# Patient Record
Sex: Female | Born: 1955 | Race: White | Hispanic: No | Marital: Married | Smoking: Never smoker
Health system: Southern US, Community
[De-identification: ages and names within clinical notes are randomized; demographics above are authoritative.]

## PROBLEM LIST (undated history)

## (undated) DIAGNOSIS — T8859XA Other complications of anesthesia, initial encounter: Secondary | ICD-10-CM

## (undated) DIAGNOSIS — R7303 Prediabetes: Secondary | ICD-10-CM

## (undated) DIAGNOSIS — I10 Essential (primary) hypertension: Secondary | ICD-10-CM

## (undated) DIAGNOSIS — E119 Type 2 diabetes mellitus without complications: Secondary | ICD-10-CM

## (undated) DIAGNOSIS — R609 Edema, unspecified: Secondary | ICD-10-CM

## (undated) DIAGNOSIS — T4145XA Adverse effect of unspecified anesthetic, initial encounter: Secondary | ICD-10-CM

## (undated) DIAGNOSIS — M1990 Unspecified osteoarthritis, unspecified site: Secondary | ICD-10-CM

## (undated) DIAGNOSIS — R600 Localized edema: Secondary | ICD-10-CM

## (undated) DIAGNOSIS — N6009 Solitary cyst of unspecified breast: Secondary | ICD-10-CM

## (undated) DIAGNOSIS — R112 Nausea with vomiting, unspecified: Secondary | ICD-10-CM

## (undated) DIAGNOSIS — Z9889 Other specified postprocedural states: Secondary | ICD-10-CM

## (undated) HISTORY — PX: TUBAL LIGATION: SHX77

## (undated) HISTORY — PX: APPENDECTOMY: SHX54

## (undated) HISTORY — PX: BREAST EXCISIONAL BIOPSY: SUR124

## (undated) HISTORY — DX: Prediabetes: R73.03

## (undated) HISTORY — DX: Solitary cyst of unspecified breast: N60.09

## (undated) HISTORY — PX: CHOLECYSTECTOMY: SHX55

---
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
## 1988-11-13 HISTORY — PX: CHOLECYSTECTOMY: SHX55

## 2001-05-14 ENCOUNTER — Other Ambulatory Visit: Admission: RE | Admit: 2001-05-14 | Discharge: 2001-05-14 | Payer: Self-pay | Admitting: *Deleted

## 2003-05-22 ENCOUNTER — Ambulatory Visit (HOSPITAL_COMMUNITY): Admission: RE | Admit: 2003-05-22 | Discharge: 2003-05-22 | Payer: Self-pay | Admitting: *Deleted

## 2003-05-22 ENCOUNTER — Encounter: Payer: Self-pay | Admitting: *Deleted

## 2003-12-28 ENCOUNTER — Inpatient Hospital Stay (HOSPITAL_COMMUNITY): Admission: AD | Admit: 2003-12-28 | Discharge: 2004-01-03 | Payer: Self-pay | Admitting: Family Medicine

## 2003-12-28 ENCOUNTER — Ambulatory Visit (HOSPITAL_COMMUNITY): Admission: RE | Admit: 2003-12-28 | Discharge: 2003-12-28 | Payer: Self-pay | Admitting: Family Medicine

## 2004-06-06 ENCOUNTER — Ambulatory Visit (HOSPITAL_COMMUNITY): Admission: RE | Admit: 2004-06-06 | Discharge: 2004-06-06 | Payer: Self-pay | Admitting: Family Medicine

## 2005-07-05 ENCOUNTER — Ambulatory Visit (HOSPITAL_COMMUNITY): Admission: RE | Admit: 2005-07-05 | Discharge: 2005-07-05 | Payer: Self-pay | Admitting: *Deleted

## 2006-06-27 ENCOUNTER — Ambulatory Visit (HOSPITAL_COMMUNITY): Admission: RE | Admit: 2006-06-27 | Discharge: 2006-06-27 | Payer: Self-pay | Admitting: Internal Medicine

## 2006-07-09 ENCOUNTER — Ambulatory Visit (HOSPITAL_COMMUNITY): Admission: RE | Admit: 2006-07-09 | Discharge: 2006-07-09 | Payer: Self-pay | Admitting: Family Medicine

## 2007-07-23 ENCOUNTER — Ambulatory Visit (HOSPITAL_COMMUNITY): Admission: RE | Admit: 2007-07-23 | Discharge: 2007-07-23 | Payer: Self-pay | Admitting: Family Medicine

## 2008-07-31 ENCOUNTER — Ambulatory Visit (HOSPITAL_COMMUNITY): Admission: RE | Admit: 2008-07-31 | Discharge: 2008-07-31 | Payer: Self-pay | Admitting: Family Medicine

## 2009-08-03 ENCOUNTER — Ambulatory Visit (HOSPITAL_COMMUNITY): Admission: RE | Admit: 2009-08-03 | Discharge: 2009-08-03 | Payer: Self-pay | Admitting: Family Medicine

## 2010-08-12 ENCOUNTER — Ambulatory Visit (HOSPITAL_COMMUNITY): Admission: RE | Admit: 2010-08-12 | Discharge: 2010-08-12 | Payer: Self-pay | Admitting: Family Medicine

## 2011-03-31 NOTE — Discharge Summary (Signed)
NAME:  Crystal Coleman, Crystal Coleman                         ACCOUNT NO.:  [id number]   MEDICAL RECORD NO.:  [id number]                   PATIENT TYPE:  INP   LOCATION:  A309                                 FACILITY:  [facility]   PHYSICIAN:  Angus G. Renard Matter, M.D.              DATE OF BIRTH:  1956-01-12   DATE OF ADMISSION:  12/28/2003  DATE OF DISCHARGE:  01/03/2004                                 DISCHARGE SUMMARY   DIAGNOSIS:  Deep vein thrombosis involving popliteal vein, superficial  femoral vein and greater saphenous vein.   CONDITION:  Stable and improved at the time of discharge.   HISTORY OF PRESENT ILLNESS:  A 55 year old white female entered the office  with complaint of pain in the left leg, three weeks duration, gradual onset.  The patient had no known injury.  She had pain behind the left knee  extending down the calf muscle, some swelling and cramps in her legs.  Patient examined and also noted to have a swollen left leg which was quite  tender.   ASSESSMENT:  Venous Doppler ultrasound was positive for DVT with thrombus  and popliteal vein, distal superficial femoral vein and greater saphenous  vein extended into the common femoral vein.  The patient was admitted to the  hospital for the purpose of anticoagulation and bed rest.   PHYSICAL EXAMINATION:  GENERAL APPEARANCE:  Uncomfortable white female with  blood pressure 130/80, pulse 60, respirations 18, temperature 98.  HEENT:  Eyes PERRLA.  TMs negative.  Oropharynx benign.  NECK:  Supple.  No JVD or thyroid abnormalities.  LUNGS:  Clear to P&A.  HEART:  Regular rate and rhythm.  No murmurs.  ABDOMEN:  No palpable organs or masses.  No hepatosplenomegaly.  PELVIC:  Not performed.  EXTREMITIES:  Tender, swollen left calf with some tenderness and swelling  over the left thigh.   LABORATORY DATA:  Admission CBC:  WBC 14,800, hemoglobin 13.9, hematocrit  40.3.  Chemistries on admission:  Sodium 139, potassium 3.8, chloride 103,  CO2 31, blood glucose 98, BUN 10, creatinine 0.8, calcium 8.7, total protein  7.1, albumin 3.3.  Subsequent chemistries on December 29, 2003:  Sodium 134,  potassium 3.8, chloride 102, CO2 28, glucose 95, BUN 9, creatinine 0.8,  calcium 8.4.  Liver enzymes on admission:  SGOT 19, SGPT 12, alkaline  phosphatase 87, total bilirubin 1.  Urinalysis 7-10 WBCs on voided specimen.  The patient's prothrombin time and INR range from normal to therapeutic by  hospital day #6 on January 03, 2004.  PT was 22.3 with INR of 2.6.   HOSPITAL COURSE:  The patient was placed at bedrest initially.  Vital signs  were monitored through q.i.d.  She was continued on Accupril 20 mg daily,  Loestrin 1/21 daily, Lovenox 1 mg/kilos subcutaneously q.12h., TED hose,  Ambien 10 mg q.h.s. p.r.n. sleep, Vicodin 5/500 p.r.n. pain.  The patient  was started on  subcutaneous Lovenox 1 mg/kilos q.12h. and Coumadin 5 mg  daily.  The patient showed progressive improvement.  Her Loestrin was  withheld.  She gradually became therapeutic with the Coumadin, although,  this was a small process.  She was dosed with Coumadin daily anywhere from  10-15 mg per day and finally INR did fall in therapeutic range at 2.6.  She  was discharged on hospital day #6.  TED hose were used in the hospital, and  it was recommended that she continue to use these as an outpatient.   DISCHARGE MEDICATIONS:  1. Vicodin 1 tablet q.4h. p.r.n. pain.  2. Accupril 20 mg daily.  3. Coumadin 7.5 mg daily.   DISCHARGE INSTRUCTIONS:  She was instructed to return to the physician's  office for follow up.     ___________________________________________                                         Ishmael Holter. Renard Matter, M.D.   AGM/MEDQ  D:  01/14/2004  T:  01/14/2004  Job:  [id number]

## 2011-03-31 NOTE — Group Therapy Note (Signed)
NAME:  REID, NAWROT                         ACCOUNT NO.:  [id number]   MEDICAL RECORD NO.:  [id number]                   PATIENT TYPE:  INP   LOCATION:  A309                                 FACILITY:  [facility]   PHYSICIAN:  Angus G. Renard Matter, M.D.              DATE OF BIRTH:  [birth year]   DATE OF PROCEDURE:  01/02/2004  DATE OF DISCHARGE:                                   PROGRESS NOTE   SUBJECTIVE:  This patient is being treated for DVT involving the left lower  leg and thigh.  She is being anticoagulated.  The pain and swelling is less  now in her thigh.  She is gradually becoming well anticoagulated.   OBJECTIVE:  Vital signs:  Blood pressure 140/69, respirations 18, pulse 72,  temperature 97.2.  Lungs:  Clear to P&A.  Heart:  Regular rhythm.  Abdomen:  No palpable organs or masses.  The patient does have some tenderness in the  left calf and left thigh.   ASSESSMENT:  The patient was admitted with deep vein thrombosis involving  the left leg.  Her condition remains stable.   PLAN:  Continue current regimen.  Will continue to monitor prothrombin time  and dose with Coumadin.  The patient should be able to be discharged  tomorrow.      ___________________________________________                                            Ishmael Holter Renard Matter, M.D.   AGM/MEDQ  D:  01/02/2004  T:  01/02/2004  Job:  [id number]

## 2011-03-31 NOTE — H&P (Signed)
NAME:  Crystal Coleman, Crystal Coleman                         ACCOUNT NO.:  [id number]   MEDICAL RECORD NO.:  [id number]                   PATIENT TYPE:  INP   LOCATION:  A309                                 FACILITY:  [facility]   PHYSICIAN:  Angus G. Renard Matter, M.D.              DATE OF BIRTH:  19-Jul-1956   DATE OF ADMISSION:  12/28/2003  DATE OF DISCHARGE:                                HISTORY & PHYSICAL   A 55 year old white female entered the office today with complaint of pain  in the left leg for three weeks duration of gradual onset. The patient has  had no known injury. She has had pain behind the knee and extending down in  the calf muscle, some swelling and cramps in the legs. The patient was  examined in the office and noted to have a swollen left leg which was quite  tender. Subsequent venous Doppler ultrasound was positive for deep venous  thrombosis with thrombus in  popliteal vein, distal superficial femoral  vein, and greater saphenous vein extending into the common femoral vein.  The patient was admitted to the hospital for the purpose of anticoagulation  and bedrest.   SOCIAL HISTORY:  The patient does not smoke or drink alcohol.   FAMILY HISTORY:  Noncontributory.   PAST MEDICAL/SURGICAL HISTORY:  The patient has a history of having had  tubal ligation in 1977. In 1990 she had cholecystectomy, appendectomy, and  cyst removed from ovary. She has had two pregnancies. No other medical  illnesses, except for hypertension and dyslipidemia.   MEDICATION LIST:  Accupril 20 mg daily.   ALLERGIES:  PENICILLIN.   REVIEW OF SYSTEMS:  HEENT: Negative. CARDIOPULMONARY: No cough, dyspnea, or  chest pain. GI:  No rebound, irregularity, or bleeding. GU: No dysuria or  hematuria.   PHYSICAL EXAMINATION:  GENERAL: Uncomfortable white female with blood  pressure 130/80, pulse 60, respirations 18, temperature 98. HEENT: PERRLA.  TMs negative. Oropharynx benign.  NECK: Supple with no JVD or thyroid  abnormalities.  LUNGS: Clear to auscultation and percussion.  HEART: Regular rhythm, no murmurs.  ABDOMEN: No palpable organs or masses. No hepatosplenomegaly.  PELVIC EXAM: Not performed.  EXTREMITIES: Tender, swollen left calf and also some tenderness and swelling  in the left thigh.   DIAGNOSIS:  Deep venous thrombosis involving the popliteal vein, superficial  femoral vein, and greater saphenous vein extending into the common femoral  vein.     ___________________________________________                                         Ishmael Holter. Renard Matter, M.D.   AGM/MEDQ  D:  12/28/2003  T:  12/28/2003  Job:  [id number]

## 2011-03-31 NOTE — Group Therapy Note (Signed)
NAME:  Crystal Coleman, Crystal Coleman                         ACCOUNT NO.:  [id number]   MEDICAL RECORD NO.:  [id number]                   PATIENT TYPE:  INP   LOCATION:  A309                                 FACILITY:  [facility]   PHYSICIAN:  Angus G. Renard Matter, M.D.              DATE OF BIRTH:  08-02-1956   DATE OF PROCEDURE:  DATE OF DISCHARGE:                                   PROGRESS NOTE   SUBJECTIVE:  This patient is being treated for deep vein thrombosis  involving the left-lower leg and thigh.  She is being anticoagulated.   OBJECTIVE:  Vital signs:  Blood pressure 138/93, respirations 20, pulse 93,  temperature 98.2.  Lungs clear to P&A.  Heart regular rhythm.  Abdomen, no  palpable organs or masses.  The patient does have continued tenderness in  the left calf and left thigh.   ASSESSMENT:  The patient was admitted with deep vein thrombosis involving  the left leg.  Her condition remains stable.   PLAN:  1. Continue current regimen.  2. We will give Coumadin 15 mg this a.m. and repeat prothrombin time this     p.m.      ___________________________________________                                            Ishmael Holter. Renard Matter, M.D.   AGM/MEDQ  D:  01/01/2004  T:  01/01/2004  Job:  [id number]

## 2011-03-31 NOTE — Group Therapy Note (Signed)
NAME:  Crystal Coleman, Crystal Coleman                         ACCOUNT NO.:  [id number]   MEDICAL RECORD NO.:  [id number]                   PATIENT TYPE:  INP   LOCATION:  A309                                 FACILITY:  [facility]   PHYSICIAN:  Angus G. Renard Matter, M.D.              DATE OF BIRTH:  13-Nov-1956   DATE OF PROCEDURE:  12/31/2003  DATE OF DISCHARGE:                                   PROGRESS NOTE   SUBJECTIVE:  This patient was admitted with DVT involving the left lower leg  and thigh.  She is being anticoagulated.   OBJECTIVE:  Vital signs:  Blood pressure 134/75, respirations 20, pulse 87,  temperature 98.2.  Lungs:  Clear to P&A.  Heart:  Regular rhythm.  Abdomen:  No palpable organs or masses.  The patient does have continued tenderness in  the left calf and left thigh.   ASSESSMENT:  The patient was admitted with deep vein thrombosis involving  the left leg.  Her condition remains stable.   PLAN:  Continue current regimen. Continue anticoagulation prior to  ambulation.      ___________________________________________                                            Ishmael Holter Renard Matter, M.D.   AGM/MEDQ  D:  12/31/2003  T:  12/31/2003  Job:  [id number]

## 2011-03-31 NOTE — Group Therapy Note (Signed)
NAME:  Crystal Coleman, Crystal Coleman                         ACCOUNT NO.:  [id number]   MEDICAL RECORD NO.:  [id number]                   PATIENT TYPE:  INP   LOCATION:  A309                                 FACILITY:  [facility]   PHYSICIAN:  Angus G. Renard Matter, M.D.              DATE OF BIRTH:  05-01-1956   DATE OF PROCEDURE:  DATE OF DISCHARGE:                                   PROGRESS NOTE   SUBJECTIVE:  This patient was admitted with DVT involving the left leg.  She  still has some pain in her leg and some swelling, but condition remains  stable.   OBJECTIVE:  VITAL SIGNS:  Blood pressure 129/70, respirations 20, pulse 102,  temperature 99.9.  LUNGS:  Lungs are clear to P&A.  HEART:  Regular rhythm.  ABDOMEN:  No palpable organs or masses.  EXTREMITIES:  The patient has some  tenderness in the calf of left leg and also in the thigh of left leg.   ASSESSMENT:  The patient was admitted with deep venous thrombosis involving  the left leg.   PLAN:  Plan to continue current efforts at anticoagulation.      ___________________________________________                                            Ishmael Holter Renard Matter, M.D.   AGM/MEDQ  D:  12/30/2003  T:  12/30/2003  Job:  [id number]

## 2011-03-31 NOTE — Group Therapy Note (Signed)
NAME:  Crystal Coleman, Crystal Coleman                         ACCOUNT NO.:  [id number]   MEDICAL RECORD NO.:  [id number]                   PATIENT TYPE:  INP   LOCATION:  A309                                 FACILITY:  [facility]   PHYSICIAN:  Angus G. Renard Matter, M.D.              DATE OF BIRTH:  1956/04/23   DATE OF PROCEDURE:  DATE OF DISCHARGE:                                   PROGRESS NOTE   This patient was admitted with DVT involving the left leg.  She has  continued to have some pain in her leg.   OBJECTIVE:  VITAL SIGNS: Blood pressure 139/86, respirations 20, pulse 92,  temperature 99.5.  LUNGS:  Clear to P&A.  HEART:  Regular rhythm.  ABDOMEN:  No palpable organs or masses.  EXTREMITIES:  Tenderness and swelling in the  calf of the left leg and also some tenderness in the left thigh.   ASSESSMENT:  The patient was admitted with DVT involving the left upper and  lower leg.   PLAN:  Plan to continue anticoagulation, daily prothrombin time.      ___________________________________________                                            Ishmael Holter Renard Matter, M.D.   AGM/MEDQ  D:  12/29/2003  T:  12/29/2003  Job:  [id number]

## 2011-08-15 ENCOUNTER — Other Ambulatory Visit (HOSPITAL_COMMUNITY): Payer: Self-pay | Admitting: Family Medicine

## 2011-08-15 DIAGNOSIS — Z139 Encounter for screening, unspecified: Secondary | ICD-10-CM

## 2011-09-05 ENCOUNTER — Ambulatory Visit (HOSPITAL_COMMUNITY)
Admission: RE | Admit: 2011-09-05 | Discharge: 2011-09-05 | Disposition: A | Discharge status: Home / Self Care | Payer: BC Managed Care – PPO | Source: Ambulatory Visit | Attending: Family Medicine | Admitting: Family Medicine

## 2011-09-05 DIAGNOSIS — Z1231 Encounter for screening mammogram for malignant neoplasm of breast: Secondary | ICD-10-CM | POA: Insufficient documentation

## 2011-09-05 DIAGNOSIS — Z139 Encounter for screening, unspecified: Secondary | ICD-10-CM

## 2012-08-26 ENCOUNTER — Other Ambulatory Visit (HOSPITAL_COMMUNITY): Payer: Self-pay | Admitting: Family Medicine

## 2012-08-26 DIAGNOSIS — IMO0001 Reserved for inherently not codable concepts without codable children: Secondary | ICD-10-CM

## 2012-09-10 ENCOUNTER — Ambulatory Visit (HOSPITAL_COMMUNITY)
Admission: RE | Admit: 2012-09-10 | Discharge: 2012-09-10 | Disposition: A | Discharge status: Home / Self Care | Payer: BC Managed Care – PPO | Source: Ambulatory Visit | Attending: Family Medicine | Admitting: Family Medicine

## 2012-09-10 DIAGNOSIS — IMO0001 Reserved for inherently not codable concepts without codable children: Secondary | ICD-10-CM

## 2012-09-10 DIAGNOSIS — Z1231 Encounter for screening mammogram for malignant neoplasm of breast: Secondary | ICD-10-CM | POA: Insufficient documentation

## 2013-09-16 ENCOUNTER — Other Ambulatory Visit (HOSPITAL_COMMUNITY): Payer: Self-pay | Admitting: Family Medicine

## 2013-09-16 DIAGNOSIS — Z139 Encounter for screening, unspecified: Secondary | ICD-10-CM

## 2013-09-29 ENCOUNTER — Ambulatory Visit (HOSPITAL_COMMUNITY)
Admission: RE | Admit: 2013-09-29 | Discharge: 2013-09-29 | Disposition: A | Discharge status: Home / Self Care | Payer: BC Managed Care – PPO | Source: Ambulatory Visit | Attending: Family Medicine | Admitting: Family Medicine

## 2013-09-29 DIAGNOSIS — Z1231 Encounter for screening mammogram for malignant neoplasm of breast: Secondary | ICD-10-CM | POA: Insufficient documentation

## 2013-09-29 DIAGNOSIS — Z139 Encounter for screening, unspecified: Secondary | ICD-10-CM

## 2014-09-08 ENCOUNTER — Other Ambulatory Visit (HOSPITAL_COMMUNITY): Payer: Self-pay | Admitting: Family Medicine

## 2014-09-08 DIAGNOSIS — Z1231 Encounter for screening mammogram for malignant neoplasm of breast: Secondary | ICD-10-CM

## 2014-10-01 ENCOUNTER — Ambulatory Visit (HOSPITAL_COMMUNITY)
Admission: RE | Admit: 2014-10-01 | Discharge: 2014-10-01 | Disposition: A | Discharge status: Home / Self Care | Payer: BC Managed Care – PPO | Source: Ambulatory Visit | Attending: Family Medicine | Admitting: Family Medicine

## 2014-10-01 DIAGNOSIS — Z1231 Encounter for screening mammogram for malignant neoplasm of breast: Secondary | ICD-10-CM | POA: Insufficient documentation

## 2015-06-11 ENCOUNTER — Ambulatory Visit (HOSPITAL_COMMUNITY): Payer: Self-pay

## 2015-06-11 ENCOUNTER — Ambulatory Visit (HOSPITAL_COMMUNITY)
Admission: RE | Admit: 2015-06-11 | Discharge: 2015-06-11 | Disposition: A | Discharge status: Home / Self Care | Payer: 59 | Source: Ambulatory Visit | Attending: Family Medicine | Admitting: Family Medicine

## 2015-06-11 ENCOUNTER — Other Ambulatory Visit (HOSPITAL_COMMUNITY): Payer: Self-pay | Admitting: Family Medicine

## 2015-06-11 DIAGNOSIS — Z8672 Personal history of thrombophlebitis: Secondary | ICD-10-CM

## 2015-06-11 DIAGNOSIS — M79662 Pain in left lower leg: Secondary | ICD-10-CM | POA: Insufficient documentation

## 2015-06-11 DIAGNOSIS — M25562 Pain in left knee: Secondary | ICD-10-CM | POA: Diagnosis not present

## 2015-06-11 DIAGNOSIS — M79605 Pain in left leg: Secondary | ICD-10-CM

## 2015-11-18 ENCOUNTER — Other Ambulatory Visit (HOSPITAL_COMMUNITY): Payer: Self-pay | Admitting: Family Medicine

## 2015-11-18 DIAGNOSIS — Z1231 Encounter for screening mammogram for malignant neoplasm of breast: Secondary | ICD-10-CM

## 2015-11-25 ENCOUNTER — Ambulatory Visit (HOSPITAL_COMMUNITY)
Admission: RE | Admit: 2015-11-25 | Discharge: 2015-11-25 | Disposition: A | Discharge status: Home / Self Care | Payer: 59 | Source: Ambulatory Visit | Attending: Family Medicine | Admitting: Family Medicine

## 2015-11-25 DIAGNOSIS — Z1231 Encounter for screening mammogram for malignant neoplasm of breast: Secondary | ICD-10-CM | POA: Diagnosis present

## 2016-07-16 ENCOUNTER — Other Ambulatory Visit: Payer: Self-pay | Admitting: Family Medicine

## 2016-12-06 ENCOUNTER — Observation Stay (HOSPITAL_COMMUNITY)
Admission: EM | Admit: 2016-12-06 | Discharge: 2016-12-08 | DRG: 603 | Disposition: A | Discharge status: Home / Self Care | Payer: 59 | Attending: Internal Medicine | Admitting: Internal Medicine

## 2016-12-06 ENCOUNTER — Encounter (HOSPITAL_COMMUNITY): Payer: Self-pay | Admitting: Emergency Medicine

## 2016-12-06 ENCOUNTER — Emergency Department (HOSPITAL_COMMUNITY): Payer: 59

## 2016-12-06 DIAGNOSIS — Z880 Allergy status to penicillin: Secondary | ICD-10-CM | POA: Diagnosis not present

## 2016-12-06 DIAGNOSIS — Z6841 Body Mass Index (BMI) 40.0 and over, adult: Secondary | ICD-10-CM | POA: Diagnosis not present

## 2016-12-06 DIAGNOSIS — L97919 Non-pressure chronic ulcer of unspecified part of right lower leg with unspecified severity: Principal | ICD-10-CM | POA: Insufficient documentation

## 2016-12-06 DIAGNOSIS — L03115 Cellulitis of right lower limb: Secondary | ICD-10-CM | POA: Diagnosis present

## 2016-12-06 DIAGNOSIS — E669 Obesity, unspecified: Secondary | ICD-10-CM | POA: Diagnosis not present

## 2016-12-06 DIAGNOSIS — R600 Localized edema: Secondary | ICD-10-CM | POA: Diagnosis present

## 2016-12-06 DIAGNOSIS — Z79899 Other long term (current) drug therapy: Secondary | ICD-10-CM | POA: Insufficient documentation

## 2016-12-06 DIAGNOSIS — I10 Essential (primary) hypertension: Secondary | ICD-10-CM | POA: Insufficient documentation

## 2016-12-06 HISTORY — DX: Unspecified osteoarthritis, unspecified site: M19.90

## 2016-12-06 HISTORY — DX: Essential (primary) hypertension: I10

## 2016-12-06 LAB — CBC WITH DIFFERENTIAL/PLATELET
BASOS ABS: 0 10*3/uL (ref 0.0–0.1)
BASOS PCT: 0 %
Eosinophils Absolute: 0.1 10*3/uL (ref 0.0–0.7)
Eosinophils Relative: 1 %
HEMATOCRIT: 42.5 % (ref 36.0–46.0)
HEMOGLOBIN: 14.6 g/dL (ref 12.0–15.0)
LYMPHS PCT: 22 %
Lymphs Abs: 2 10*3/uL (ref 0.7–4.0)
MCH: 31.9 pg (ref 26.0–34.0)
MCHC: 34.4 g/dL (ref 30.0–36.0)
MCV: 92.8 fL (ref 78.0–100.0)
MONO ABS: 1 10*3/uL (ref 0.1–1.0)
MONOS PCT: 11 %
NEUTROS ABS: 6.1 10*3/uL (ref 1.7–7.7)
NEUTROS PCT: 66 %
Platelets: 314 10*3/uL (ref 150–400)
RBC: 4.58 MIL/uL (ref 3.87–5.11)
RDW: 12 % (ref 11.5–15.5)
WBC: 9.3 10*3/uL (ref 4.0–10.5)

## 2016-12-06 LAB — BASIC METABOLIC PANEL
ANION GAP: 9 (ref 5–15)
BUN: 13 mg/dL (ref 6–20)
CHLORIDE: 101 mmol/L (ref 101–111)
CO2: 28 mmol/L (ref 22–32)
Calcium: 9.3 mg/dL (ref 8.9–10.3)
Creatinine, Ser: 0.71 mg/dL (ref 0.44–1.00)
GFR calc Af Amer: >60 mL/min (ref >60)
GFR calc non Af Amer: >60 mL/min (ref >60)
GLUCOSE: 101 mg/dL — AB (ref 65–99)
POTASSIUM: 3.4 mmol/L — AB (ref 3.5–5.1)
Sodium: 138 mmol/L (ref 135–145)

## 2016-12-06 LAB — LACTIC ACID, PLASMA: Lactic Acid, Venous: 1.9 mmol/L (ref 0.5–1.9)

## 2016-12-06 LAB — MAGNESIUM: Magnesium: 2 mg/dL (ref 1.7–2.4)

## 2016-12-06 LAB — SEDIMENTATION RATE: Sed Rate: 48 mm/hr — ABNORMAL HIGH (ref 0–22)

## 2016-12-06 MED ORDER — CLINDAMYCIN PHOSPHATE 600 MG/50ML IV SOLN
INTRAVENOUS | Status: AC
  Filled 2016-12-06: qty 50

## 2016-12-06 MED ORDER — QUINAPRIL HCL 10 MG PO TABS
20.0000 mg | ORAL_TABLET | Freq: Two times a day (BID) | ORAL | Status: DC
  Filled 2016-12-06 (×4): qty 2

## 2016-12-06 MED ORDER — ACETAMINOPHEN 325 MG PO TABS: 650.0000 mg | ORAL_TABLET | Freq: Four times a day (QID) | ORAL | Status: DC | PRN

## 2016-12-06 MED ORDER — POTASSIUM CHLORIDE CRYS ER 20 MEQ PO TBCR
40.0000 meq | EXTENDED_RELEASE_TABLET | Freq: Two times a day (BID) | ORAL | Status: DC
  Administered 2016-12-06 – 2016-12-08 (×4): 40 meq via ORAL
  Filled 2016-12-06 (×4): qty 2

## 2016-12-06 MED ORDER — SODIUM CHLORIDE 0.9 % IV SOLN
1.0000 g | Freq: Once | INTRAVENOUS | Status: AC
  Administered 2016-12-07: 1 g via INTRAVENOUS
  Filled 2016-12-06: qty 1

## 2016-12-06 MED ORDER — AMLODIPINE BESYLATE 5 MG PO TABS
5.0000 mg | ORAL_TABLET | Freq: Every day | ORAL | Status: DC
  Administered 2016-12-06 – 2016-12-08 (×3): 5 mg via ORAL
  Filled 2016-12-06 (×3): qty 1

## 2016-12-06 MED ORDER — ONDANSETRON HCL 4 MG/2ML IJ SOLN: 4.0000 mg | Freq: Four times a day (QID) | INTRAMUSCULAR | Status: DC | PRN

## 2016-12-06 MED ORDER — HYDROCODONE-ACETAMINOPHEN 5-325 MG PO TABS
1.0000 | ORAL_TABLET | ORAL | Status: DC | PRN
  Administered 2016-12-06 – 2016-12-08 (×4): 1 via ORAL
  Filled 2016-12-06 (×4): qty 1

## 2016-12-06 MED ORDER — ONDANSETRON HCL 4 MG PO TABS: 4.0000 mg | ORAL_TABLET | Freq: Four times a day (QID) | ORAL | Status: DC | PRN

## 2016-12-06 MED ORDER — VANCOMYCIN HCL 10 G IV SOLR
2000.0000 mg | Freq: Once | INTRAVENOUS | Status: AC
  Administered 2016-12-07: 2000 mg via INTRAVENOUS
  Filled 2016-12-06: qty 2000

## 2016-12-06 MED ORDER — HYDROCHLOROTHIAZIDE 12.5 MG PO CAPS
12.5000 mg | ORAL_CAPSULE | Freq: Every day | ORAL | Status: DC
  Administered 2016-12-08: 12.5 mg via ORAL
  Filled 2016-12-06: qty 1

## 2016-12-06 MED ORDER — CLINDAMYCIN PHOSPHATE 600 MG/50ML IV SOLN
600.0000 mg | Freq: Once | INTRAVENOUS | Status: AC
  Administered 2016-12-06: 600 mg via INTRAVENOUS
  Filled 2016-12-06: qty 50

## 2016-12-06 MED ORDER — ACETAMINOPHEN 650 MG RE SUPP: 650.0000 mg | Freq: Four times a day (QID) | RECTAL | Status: DC | PRN

## 2016-12-06 MED ORDER — ENOXAPARIN SODIUM 60 MG/0.6ML ~~LOC~~ SOLN
50.0000 mg | SUBCUTANEOUS | Status: DC
  Administered 2016-12-06 – 2016-12-07 (×2): 50 mg via SUBCUTANEOUS
  Filled 2016-12-06 (×2): qty 0.6

## 2016-12-06 MED ORDER — SENNOSIDES-DOCUSATE SODIUM 8.6-50 MG PO TABS: 1.0000 | ORAL_TABLET | Freq: Every evening | ORAL | Status: DC | PRN

## 2016-12-06 NOTE — ED Triage Notes (Signed)
Sent here by [facility] for right leg wound.  Denies any pain in right leg.  Dressing intact but redness noted outside to 4x4 dressing.  Area is hot to touch.  No history of DM.  Started out a small area to anterior lower leg.  Was treated with compression socks and area got worse.  Then pt treated with OTC wound gel and then got redness, swelling and heat.  All this occurred in one week period.

## 2016-12-06 NOTE — ED Provider Notes (Signed)
[place] [facility] DEPT Provider Note   CSN: [id number] Arrival date & time: 12/06/16  1416     History   Chief Complaint Chief Complaint  Patient presents with  . Extremity Weakness    right    HPI Crystal Coleman is a 61 y.o. female.  HPI  Pt was seen at 1450. Per pt, c/o gradual onset and worsening of persistent RLE "redness" and "wound" over the past 1 week. Pt states she started with a small area of "blister" on her right lower anterior mid-tibial area. States she was wearing compression socks and "it got bigger." The area has now ulcerated, draining purulent fluid, with surrounding redness and warmth. Denies pain in leg or calf. Denies focal motor weakness, no tingling/numbness in extremities, no fevers.   Past Medical History:  Diagnosis Date  . Arthritis   . Hypertension     There are no active problems to display for this patient.   Past Surgical History:  Procedure Laterality Date  . CHOLECYSTECTOMY      OB History    Gravida Para Term Preterm AB Living   2         3   SAB TAB Ectopic Multiple Live Births                   Home Medications    Prior to Admission medications   Medication Sig Start Date End Date Taking? Authorizing Provider  amLODipine (NORVASC) 5 MG tablet Take 5 mg by mouth daily. 11/08/16  Yes Historical Provider, MD  furosemide (LASIX) 20 MG tablet Take 20 mg by mouth as needed for fluid.  11/19/16  Yes Historical Provider, MD  ibuprofen (ADVIL,MOTRIN) 200 MG tablet Take 200 mg by mouth as needed for moderate pain.   Yes Historical Provider, MD  oxymetazoline (AFRIN) 0.05 % nasal spray Place 2 sprays into both nostrils as needed for congestion.   Yes Historical Provider, MD  quinapril (ACCUPRIL) 20 MG tablet Take 20 mg by mouth 2 (two) times daily. 11/19/16  Yes Historical Provider, MD  triamcinolone cream (KENALOG) 0.1 % Apply 1 application topically as needed (itch).  11/08/16  Yes Historical Provider, MD  hydrochlorothiazide (MICROZIDE)  12.5 MG capsule Take 12.5 mg by mouth daily. 09/10/16   Historical Provider, MD    Family History History reviewed. No pertinent family history.  Social History Social History  Substance Use Topics  . Smoking status: Never Smoker  . Smokeless tobacco: Never Used  . Alcohol use No     Allergies   Penicillins   Review of Systems Review of Systems ROS: Statement: All systems negative except as marked or noted in the HPI; Constitutional: Negative for fever and chills. ; ; Eyes: Negative for eye pain, redness and discharge. ; ; ENMT: Negative for ear pain, hoarseness, nasal congestion, sinus pressure and sore throat. ; ; Cardiovascular: Negative for chest pain, palpitations, diaphoresis, dyspnea and peripheral edema. ; ; Respiratory: Negative for cough, wheezing and stridor. ; ; Gastrointestinal: Negative for nausea, vomiting, diarrhea, abdominal pain, blood in stool, hematemesis, jaundice and rectal bleeding. . ; ; Genitourinary: Negative for dysuria, flank pain and hematuria. ; ; Musculoskeletal: Negative for back pain and neck pain. Negative for swelling and trauma.; ; Skin: Negative for pruritus, abrasions, blisters, bruising and +rash, skin ulceration..; ; Neuro: Negative for headache, lightheadedness and neck stiffness. Negative for weakness, altered level of consciousness, altered mental status, extremity weakness, paresthesias, involuntary movement, seizure and syncope.  Physical Exam Updated Vital Signs BP 194/93   Pulse 76   Temp 97.5 F (36.4 C) (Rectal)   Resp 16   Ht 5\' 3"  (1.6 m)   Wt 237 lb (107.5 kg)   SpO2 98%   BMI 41.98 kg/m   Physical Exam 1455: Physical examination:  Nursing notes reviewed; Vital signs and O2 SAT reviewed;  Constitutional: Well developed, Well nourished, Well hydrated, In no acute distress; Head:  Normocephalic, atraumatic; Eyes: EOMI, PERRL, No scleral icterus; ENMT: Mouth and pharynx normal, Mucous membranes moist; Neck: Supple, Full  range of motion, No lymphadenopathy; Cardiovascular: Regular rate and rhythm, No gallop; Respiratory: Breath sounds clear & equal bilaterally, No wheezes.  Speaking full sentences with ease, Normal respiratory effort/excursion; Chest: Nontender, Movement normal; Abdomen: Soft, Nontender, Nondistended, Normal bowel sounds; Genitourinary: No CVA tenderness; Extremities: Pulses normal, +right lower anterior mid-tibal area with 5cm diameter ulcerated wound, approximately 1.5cm deep, draining purulent fluid. +near circumferential surrounding erythema and warmth. No streaking up leg.  Strong pedal pulses. No tenderness, No edema, No calf asymmetry.; Neuro: AA&Ox3, Major CN grossly intact.  Speech clear. No gross focal motor or sensory deficits in extremities.; Skin: Color normal, Warm, Dry.   ED Treatments / Results  Labs (all labs ordered are listed, but only abnormal results are displayed)   EKG  EKG Interpretation None       Radiology   Procedures Procedures (including critical care time)  Medications Ordered in ED Medications  clindamycin (CLEOCIN) IVPB 600 mg (600 mg Intravenous New Bag/Given 12/06/16 1642)     Initial Impression / Assessment and Plan / ED Course  I have reviewed the triage vital signs and the nursing notes.  Pertinent labs & imaging results that were available during my care of the patient were reviewed by me and considered in my medical decision making (see chart for details).  MDM Reviewed: previous chart, nursing note and vitals Reviewed previous: labs Interpretation: labs and x-ray   Results for orders placed or performed during the hospital encounter of [id number]  Basic metabolic panel  Result Value Ref Range   Sodium 138 135 - 145 mmol/L   Potassium 3.4 (L) 3.5 - 5.1 mmol/L   Chloride 101 101 - 111 mmol/L   CO2 28 22 - 32 mmol/L   Glucose, Bld 101 (H) 65 - 99 mg/dL   BUN 13 6 - 20 mg/dL   Creatinine, Ser 0.71 0.44 - 1.00 mg/dL   Calcium 9.3 8.9 -  10.3 mg/dL   GFR calc non Af Amer >60 >60 mL/min   GFR calc Af Amer >60 >60 mL/min   Anion gap 9 5 - 15  Lactic acid, plasma  Result Value Ref Range   Lactic Acid, Venous 1.9 0.5 - 1.9 mmol/L  CBC with Differential  Result Value Ref Range   WBC 9.3 4.0 - 10.5 K/uL   RBC 4.58 3.87 - 5.11 MIL/uL   Hemoglobin 14.6 12.0 - 15.0 g/dL   HCT 42.5 36.0 - 46.0 %   MCV 92.8 78.0 - 100.0 fL   MCH 31.9 26.0 - 34.0 pg   MCHC 34.4 30.0 - 36.0 g/dL   RDW 12.0 11.5 - 15.5 %   Platelets 314 150 - 400 K/uL   Neutrophils Relative % 66 %   Neutro Abs 6.1 1.7 - 7.7 K/uL   Lymphocytes Relative 22 %   Lymphs Abs 2.0 0.7 - 4.0 K/uL   Monocytes Relative 11 %   Monocytes Absolute 1.0 0.1 - 1.0  K/uL   Eosinophils Relative 1 %   Eosinophils Absolute 0.1 0.0 - 0.7 K/uL   Basophils Relative 0 %   Basophils Absolute 0.0 0.0 - 0.1 K/uL   Dg Tibia/fibula Right Result Date: 12/06/2016 CLINICAL DATA:  Right lower leg wound with redness and swelling. EXAM: RIGHT TIBIA AND FIBULA - 2 VIEW COMPARISON:  None. FINDINGS: There is soft tissue irregularity and a rounded lucency in the anterior lower leg at the level of the mid tibia presumably corresponding to the known it wound. Subcutaneous fat reticulation is present throughout the lower leg. Swelling extends to the ankle. The tibia and fibula appear intact without evidence of fracture or erosion. Moderate marginal osteophytosis is present at the knee involving the medial and lateral compartments. Plantar and posterior calcaneal enthesophytes are present. A small well corticated ossicle projects adjacent to the medial malleolus. No dissecting soft tissue emphysema is identified. IMPRESSION: Cellulitic changes throughout the lower leg. No evidence of acute osseous abnormality. Electronically Signed   By: Logan Bores M.D.   On: 12/06/2016 15:46    1725:  IV clindamycin given after wound culture obtained. T/C to [facility]. Denton Brick, case discussed, including:  HPI, pertinent  PM/SHx, VS/PE, dx testing, ED course and treatment:  Agreeable to come to ED for evaluation.   Final Clinical Impressions(s) / ED Diagnoses   Final diagnoses:  None    New Prescriptions New Prescriptions   No medications on file     Francine Graven, DO 12/08/16 1612

## 2016-12-06 NOTE — H&P (Addendum)
Patient Demographics:    Crystal Coleman, is a 61 y.o. female  MRN: [id number]   DOB - 1956/10/12  Admit Date - 12/06/2016  Outpatient Primary MD for the patient is Jani Gravel, MD   Assessment & Plan:    Principal Problem:   Cellulitis of leg, right Active Problems:   Ulcer of right leg (HCC)   HTN (hypertension)   Lower extremity edema  Right lower extremity Cellulitis- Likely from purulent ulcer, with surrounding warmth and redness. No fevers or WBC, vitals stable, hypertensive, no lactic acidosis. Xray leg- neg for acute osseus abnormality, no gas. Ulcer location and appearance not indicative of arterial insufficiency-good pulses, or venous insufficiency. - Admit to inpt - MRI tibia/fib WO contrast - Vanc and meropenem for empiric coverage (pt has penicillin allergy) - ESR,CRP - Hgba1c - Wound care consult - f/u Blood culture results - F/u wound culture results in ED, but consider high likelihood of contaminating skin flora. - Please consider ordering ABI/and or arterial dopplers in am  HTN- bp- Home meds- HCTZ-12.5, norvasc 5, quinapril 20 BID - Low K, 3.4, replete 52mq Kdur -Bmet am - MAg level  With History of - Reviewed by me  Past Medical History:  Diagnosis Date  . Arthritis   . Hypertension       Past Surgical History:  Procedure Laterality Date  . CHOLECYSTECTOMY      Chief Complaint  Patient presents with  . Extremity Weakness    right      HPI:    Crystal Coleman is a 61y.o. female, with PMH- HTN, chronic lower extremity edema. Presented with Complaints of right lower extremity redness,warmth and pain that started 1 week ago.   Prior to that she had an ulcer mid leg, started about 1-2 months ago. She is not sure about how ulcer started, but she denies trauma or injury. She  says the compression stockings she wore and over-the-counter wound gel (from [facility]) she was applying has gradually made it worse.  She denies fever, malaise, and vomiting. No prior similar ulcers, not diagnosed with DM.   In the ED- vitals were stable, temp- 98.5 F, Xray leg- No acute osseous abnormality, WBC- 9.3, Lactic acid- 1.9. Pt was given  Clindamycin in the ED, hospitalist called for further evaluation.   Review of systems:    In addition to the HPI above,   A full 12 point Review of 10 Systems was done, except as stated above, all other Review of 10 Systems were negative.    Social History:  Reviewed by me    Social History  Substance Use Topics  . Smoking status: Never Smoker  . Smokeless tobacco: Never Used  . Alcohol use No       Family History :  Reviewed by me   History reviewed. No pertinent family history.   Home Medications:   Prior to Admission medications   Medication Sig Start Date  End Date Taking? Authorizing Provider  amLODipine (NORVASC) 5 MG tablet Take 5 mg by mouth daily. 11/08/16  Yes Historical Provider, MD  furosemide (LASIX) 20 MG tablet Take 20 mg by mouth as needed for fluid.  11/19/16  Yes Historical Provider, MD  ibuprofen (ADVIL,MOTRIN) 200 MG tablet Take 200 mg by mouth as needed for moderate pain.   Yes Historical Provider, MD  oxymetazoline (AFRIN) 0.05 % nasal spray Place 2 sprays into both nostrils as needed for congestion.   Yes Historical Provider, MD  quinapril (ACCUPRIL) 20 MG tablet Take 20 mg by mouth 2 (two) times daily. 11/19/16  Yes Historical Provider, MD  triamcinolone cream (KENALOG) 0.1 % Apply 1 application topically as needed (itch).  11/08/16  Yes Historical Provider, MD  hydrochlorothiazide (MICROZIDE) 12.5 MG capsule Take 12.5 mg by mouth daily. 09/10/16   Historical Provider, MD     Allergies:     Allergies  Allergen Reactions  . Penicillins     Has patient had a PCN reaction causing immediate rash,  facial/tongue/throat swelling, SOB or lightheadedness with hypotension: Yes Has patient had a PCN reaction causing severe rash involving mucus membranes or skin necrosis: No Has patient had a PCN reaction that required hospitalization No Has patient had a PCN reaction occurring within the last 10 years: No If all of the above answers are "NO", then may proceed with Cephalosporin use.      Physical Exam:   Vitals  Blood pressure 194/93, pulse 76, temperature 97.5 F (36.4 C), temperature source Rectal, resp. rate 16, height '5\' 3"'  (1.6 m), weight 107.5 kg (237 lb), SpO2 98 %. Physical Examination: General appearance - alert, well appearing, and in no distress  Mental status - alert, oriented to person, place, and time, Eyes - sclera anicteric Neck - supple, no JVD elevation , Chest - clear  to auscultation bilaterally, symmetrical air movement, Heart - S1 and S2 normal,  Abdomen - soft, nontender, nondistended, no masses or organomegaly Neurological - screening mental status exam normal, neck supple without rigidity, cranial nerves II through XII intact, DTR's normal and symmetric Extremities - mild bilat pedal edema- pitting, with varicose veins bilat,  Right leg- Approx 5 by 5 inch purulent ulcer, round, with heaped up edges, appears deep, difficult to stage due to purulence, with surrounding erythema and warmth, tenderness, extending about 10cm around ulcer. 2+ bilat pulses-DP.  Skin - warm, dry, ulcer- described above.    Data Review:    CBC  Recent Labs Lab 12/06/16 1449  WBC 9.3  HGB 14.6  HCT 42.5  PLT 314  MCV 92.8  MCH 31.9  MCHC 34.4  RDW 12.0  LYMPHSABS 2.0  MONOABS 1.0  EOSABS 0.1  BASOSABS 0.0   ------------------------------------------------------------------------------------------------------------------  Chemistries   Recent Labs Lab 12/06/16 1449  NA 138  K 3.4*  CL 101  CO2 28  GLUCOSE 101*  BUN 13  CREATININE 0.71  CALCIUM 9.3    ------------------------------------------------------------------------------------------------------------------ estimated creatinine clearance is 85.6 mL/min (by C-G formula based on SCr of 0.71 mg/dL). ------------------------------------------------------------------------------------------------------------------ No results for input(s): TSH, T4TOTAL, T3FREE, THYROIDAB in the last 72 hours.  Invalid input(s): FREET3   Coagulation profile No results for input(s): INR, PROTIME in the last 168 hours. ------------------------------------------------------------------------------------------------------------------- No results for input(s): DDIMER in the last 72 hours. -------------------------------------------------------------------------------------------------------------------  Cardiac Enzymes No results for input(s): CKMB, TROPONINI, MYOGLOBIN in the last 168 hours.  Invalid input(s): CK ------------------------------------------------------------------------------------------------------------------ No results found for: BNP   ---------------------------------------------------------------------------------------------------------------  Urinalysis No results  found for: COLORURINE, APPEARANCEUR, LABSPEC, [place], GLUCOSEU, HGBUR, BILIRUBINUR, KETONESUR, PROTEINUR, UROBILINOGEN, NITRITE, LEUKOCYTESUR  ----------------------------------------------------------------------------------------------------------------   Imaging Results:    Dg Tibia/fibula Right  Result Date: 12/06/2016 CLINICAL DATA:  Right lower leg wound with redness and swelling. EXAM: RIGHT TIBIA AND FIBULA - 2 VIEW COMPARISON:  None. FINDINGS: There is soft tissue irregularity and a rounded lucency in the anterior lower leg at the level of the mid tibia presumably corresponding to the known it wound. Subcutaneous fat reticulation is present throughout the lower leg. Swelling extends to the ankle. The tibia  and fibula appear intact without evidence of fracture or erosion. Moderate marginal osteophytosis is present at the knee involving the medial and lateral compartments. Plantar and posterior calcaneal enthesophytes are present. A small well corticated ossicle projects adjacent to the medial malleolus. No dissecting soft tissue emphysema is identified. IMPRESSION: Cellulitic changes throughout the lower leg. No evidence of acute osseous abnormality. Electronically Signed   By: Logan Bores M.D.   On: 12/06/2016 15:46    Radiological Exams on Admission: Dg Tibia/fibula Right  Result Date: 12/06/2016 CLINICAL DATA:  Right lower leg wound with redness and swelling. EXAM: RIGHT TIBIA AND FIBULA - 2 VIEW COMPARISON:  None. FINDINGS: There is soft tissue irregularity and a rounded lucency in the anterior lower leg at the level of the mid tibia presumably corresponding to the known it wound. Subcutaneous fat reticulation is present throughout the lower leg. Swelling extends to the ankle. The tibia and fibula appear intact without evidence of fracture or erosion. Moderate marginal osteophytosis is present at the knee involving the medial and lateral compartments. Plantar and posterior calcaneal enthesophytes are present. A small well corticated ossicle projects adjacent to the medial malleolus. No dissecting soft tissue emphysema is identified. IMPRESSION: Cellulitic changes throughout the lower leg. No evidence of acute osseous abnormality. Electronically Signed   By: Logan Bores M.D.   On: 12/06/2016 15:46    DVT Prophylaxis -Lovenox AM Labs Ordered, also please review Full Orders  Family Communication: Admission, patients condition and plan of care including tests being ordered have been discussed with the patient  who indicate understanding and agree with the plan   Code Status - Full Code  Likely DC to  Home  Condition   stable  Bethena Roys M.D on 12/06/2016 at 6:51 PM  Between 3pm to 12  midnight - Pager - [phone]  After 7pm go to [email or web address] - password [email or web address]  [facility] - Office  [phone]  Dragon dictation system was used to create this note, attempts have been made to correct errors, however presence of uncorrected errors is not a reflection quality of care provided.

## 2016-12-06 NOTE — Progress Notes (Signed)
ANTIBIOTIC CONSULT NOTE-Preliminary  Pharmacy Consult for Vancomycin and Merrem Indication: Cellulitis  Allergies  Allergen Reactions  . Penicillins     Has patient had a PCN reaction causing immediate rash, facial/tongue/throat swelling, SOB or lightheadedness with hypotension: Yes Has patient had a PCN reaction causing severe rash involving mucus membranes or skin necrosis: No Has patient had a PCN reaction that required hospitalization No Has patient had a PCN reaction occurring within the last 10 years: No If all of the above answers are "NO", then may proceed with Cephalosporin use.     Patient Measurements: Height: 5\' 3"  (160 cm) Weight: 235 lb 9.6 oz (106.9 kg) IBW/kg (Calculated) : 52.4  Vital Signs: Temp: 99 F (37.2 C) (01/24 2041) Temp Source: Oral (01/24 2041) BP: 215/92 (01/24 2041) Pulse Rate: 95 (01/24 2041)  Labs:  Recent Labs  12/06/16 1449  WBC 9.3  HGB 14.6  PLT 314  CREATININE 0.71    Estimated Creatinine Clearance: 85.4 mL/min (by C-G formula based on SCr of 0.71 mg/dL).  No results for input(s): VANCOTROUGH, VANCOPEAK, VANCORANDOM, GENTTROUGH, GENTPEAK, GENTRANDOM, TOBRATROUGH, TOBRAPEAK, TOBRARND, AMIKACINPEAK, AMIKACINTROU, AMIKACIN in the last 72 hours.   Microbiology: Recent Results (from the past 720 hour(s))  Wound or Superficial Culture     Status: None (Preliminary result)   Collection Time: 12/06/16  3:00 PM  Result Value Ref Range Status   Specimen Description   Final    LEG RIGHT Performed at [facility], [place] [address], [place], [place] [address]    Special Requests Normal  Final   Gram Stain PENDING  Incomplete   Culture PENDING  Incomplete   Report Status PENDING  Incomplete    Medical History: Past Medical History:  Diagnosis Date  . Arthritis   . Hypertension     Medications:   Assessment: 61 yo obese female with LLE wound and cellulitis with purulent drainage. Pt is to be given antibiotics  empirically.  Goal of Therapy:  Vancomycin troughs 10-15 mcg/ml Eradicated infection  Plan:  Preliminary review of pertinent patient information completed.  Protocol will be initiated with one-time doses of Vancomycin 2000 mg IV and Merrem 1 Gm IV.  Forestine Na clinical pharmacist will complete review during morning rounds to assess patient and finalize treatment regimen.  Norberto Sorenson, [facility] 12/06/2016,11:42 PM

## 2016-12-06 NOTE — ED Notes (Signed)
Hospitalist at bedside 

## 2016-12-07 ENCOUNTER — Inpatient Hospital Stay (HOSPITAL_COMMUNITY): Payer: 59

## 2016-12-07 DIAGNOSIS — L03115 Cellulitis of right lower limb: Secondary | ICD-10-CM

## 2016-12-07 LAB — BASIC METABOLIC PANEL
Anion gap: 9 (ref 5–15)
BUN: 13 mg/dL (ref 6–20)
CALCIUM: 8.7 mg/dL — AB (ref 8.9–10.3)
CO2: 28 mmol/L (ref 22–32)
CREATININE: 0.77 mg/dL (ref 0.44–1.00)
Chloride: 102 mmol/L (ref 101–111)
GFR calc Af Amer: >60 mL/min (ref >60)
Glucose, Bld: 104 mg/dL — ABNORMAL HIGH (ref 65–99)
POTASSIUM: 3.8 mmol/L (ref 3.5–5.1)
SODIUM: 139 mmol/L (ref 135–145)

## 2016-12-07 LAB — C-REACTIVE PROTEIN: CRP: 3.8 mg/dL — AB (ref <1.0)

## 2016-12-07 MED ORDER — SODIUM CHLORIDE 0.9 % IV SOLN
1.0000 g | Freq: Three times a day (TID) | INTRAVENOUS | Status: DC
  Administered 2016-12-07: 1 g via INTRAVENOUS
  Filled 2016-12-07 (×5): qty 1

## 2016-12-07 MED ORDER — SODIUM CHLORIDE 0.9 % IV SOLN
INTRAVENOUS | Status: AC
  Filled 2016-12-07: qty 1

## 2016-12-07 MED ORDER — VANCOMYCIN HCL IN DEXTROSE 750-5 MG/150ML-% IV SOLN
750.0000 mg | Freq: Two times a day (BID) | INTRAVENOUS | Status: DC
  Administered 2016-12-07 – 2016-12-08 (×2): 750 mg via INTRAVENOUS
  Filled 2016-12-07 (×9): qty 150

## 2016-12-07 MED ORDER — LISINOPRIL 10 MG PO TABS
20.0000 mg | ORAL_TABLET | Freq: Two times a day (BID) | ORAL | Status: DC
  Administered 2016-12-07 – 2016-12-08 (×3): 20 mg via ORAL
  Filled 2016-12-07 (×3): qty 2

## 2016-12-07 MED ORDER — COLLAGENASE 250 UNIT/GM EX OINT
TOPICAL_OINTMENT | Freq: Every day | CUTANEOUS | Status: DC
  Administered 2016-12-07 – 2016-12-08 (×2): via TOPICAL
  Filled 2016-12-07: qty 30

## 2016-12-07 MED ORDER — VANCOMYCIN HCL IN DEXTROSE 750-5 MG/150ML-% IV SOLN
750.0000 mg | Freq: Two times a day (BID) | INTRAVENOUS | Status: DC
  Filled 2016-12-07 (×3): qty 150

## 2016-12-07 MED ORDER — VANCOMYCIN HCL IN DEXTROSE 1-5 GM/200ML-% IV SOLN: 1000.0000 mg | Freq: Once | INTRAVENOUS | Status: DC

## 2016-12-07 MED ORDER — VANCOMYCIN HCL 1000 MG IV SOLR
INTRAVENOUS | Status: AC
  Filled 2016-12-07: qty 2000

## 2016-12-07 NOTE — Progress Notes (Signed)
Pharmacy Antibiotic Note  Crystal Coleman is a 61 y.o. female admitted on 12/06/2016 with cellulitis.  Pharmacy has been consulted for Vancomycin and Merrem dosing.  Plan: Vancomycin 2000mg  loading dose, then 750mg  IV q12h. Goal trough 10-66mcg/ml Merrem 1gm IV q8h F/U cxs and clinical progress Levels as indicated, monitor V/S and labs  Height: 5\' 3"  (160 cm) Weight: 235 lb 9.6 oz (106.9 kg) IBW/kg (Calculated) : 52.4  Temp (24hrs), Avg:98.4 F (36.9 C), Min:97.5 F (36.4 C), Max:99 F (37.2 C)   Recent Labs Lab 12/06/16 1449 12/06/16 1450 12/07/16 0504  WBC 9.3  --   --   CREATININE 0.71  --  0.77  LATICACIDVEN  --  1.9  --     Normalized CrCl = 35ml/min Estimated Creatinine Clearance: 85.4 mL/min (by C-G formula based on SCr of 0.77 mg/dL).    Allergies  Allergen Reactions  . Penicillins     Has patient had a PCN reaction causing immediate rash, facial/tongue/throat swelling, SOB or lightheadedness with hypotension: Yes Has patient had a PCN reaction causing severe rash involving mucus membranes or skin necrosis: No Has patient had a PCN reaction that required hospitalization No Has patient had a PCN reaction occurring within the last 10 years: No If all of the above answers are "NO", then may proceed with Cephalosporin use.     Antimicrobials this admission: Vancomycin 1/25 >>  Merrem 1/25 >>   Microbiology results: 1/24 Wound cx: GNR, GPC  Thank you for allowing pharmacy to be a part of this patient's care.  Isac Sarna, BS Pharm D, [place] Clinical Pharmacist Pager [phone] 12/07/2016 7:56 AM

## 2016-12-07 NOTE — Consult Note (Signed)
[place] Nurse wound consult note Reason for Consult: Nonhealing wound to right anterior lower leg.  Infectious.  States her removable compression is making this worse.  On Vancomycin currently.  ED physician has recommended ABI and/or arterial dopplers.  Wound type:Infectious, nonhealing Pressure Injury POA: N/A Measurement: 5 cm x 5 cm x 0.5 cm with devitalized tissue to wound bed.  Purulent drainage  No odor.  Periwound:Erythema and edema Dressing procedure/placement/frequency:Cleanse wound to right lower leg with NS and pat gently dry.  Apply Santyl to wound bed.  Cover with NS moist gauze.  Secure with 4x4 gauze and kerlix/tape.  Change daily.  Will await further diagnostic testing to determine if compression is warranted at this time.  Will not follow at this time.  Please re-consult if needed.  Domenic Moras RN BSN [place] Pager [phone]

## 2016-12-07 NOTE — Progress Notes (Signed)
PROGRESS NOTE    Crystal Coleman  [id number] DOB: 10/13/1956 DOA: 12/06/2016 PCP: Jani Gravel, MD     Brief Narrative:  61 year old woman admitted from home on 1/24 due to a right anterior lower leg ulceration with cellulitis.   Assessment & Plan:   Principal Problem:   Cellulitis of leg, right Active Problems:   Ulcer of right leg (HCC)   HTN (hypertension)   Lower extremity edema   Right lower extremity cellulitis -Likely emanating from Ulcer, patient states she had a small lesion that got worse with her compression stockings. -She has not had fevers or leukocytosis.  -Will discontinue meropenem and leave her on vancomycin only for now. -MRI without evidence for abscess or myositis or any other drainable lesion.    DVT prophylaxis: lovenox Code Status: full code Family Communication: husband at bedside updated on plan of care and all questions answered Disposition Plan: home when ready  Consultants:   None  Procedures:   None  Antimicrobials:  Anti-infectives    Start     Dose/Rate Route Frequency Ordered Stop   12/07/16 1300  vancomycin (VANCOCIN) IVPB 750 mg/150 ml premix  Status:  Discontinued     750 mg 150 mL/hr over 60 Minutes Intravenous Every 12 hours 12/07/16 0804 12/07/16 1154   12/07/16 1300  vancomycin (VANCOCIN) IVPB 750 mg/150 ml premix     750 mg 150 mL/hr over 60 Minutes Intravenous Every 12 hours 12/07/16 1208     12/07/16 1200  vancomycin (VANCOCIN) IVPB 1000 mg/200 mL premix  Status:  Discontinued     1,000 mg 200 mL/hr over 60 Minutes Intravenous  Once 12/07/16 1154 12/07/16 1208   12/07/16 0900  meropenem (MERREM) 1 g in sodium chloride 0.9 % 100 mL IVPB  Status:  Discontinued     1 g 200 mL/hr over 30 Minutes Intravenous Every 8 hours 12/07/16 0804 12/07/16 1154   12/07/16 0100  meropenem (MERREM) 1 g in sodium chloride 0.9 % 100 mL IVPB     1 g 200 mL/hr over 30 Minutes Intravenous  Once 12/06/16 2341 12/07/16 0101   12/06/16  2345  vancomycin (VANCOCIN) 2,000 mg in sodium chloride 0.9 % 500 mL IVPB     2,000 mg 250 mL/hr over 120 Minutes Intravenous  Once 12/06/16 2341 12/07/16 0319   12/06/16 1645  clindamycin (CLEOCIN) IVPB 600 mg     600 mg 100 mL/hr over 30 Minutes Intravenous  Once 12/06/16 1630 12/06/16 1712       Subjective: Feel well other than pain to the right lower leg   Objective: Vitals:   12/06/16 1655 12/06/16 1928 12/06/16 2041 12/07/16 0551  BP: 194/93 (!) 214/97 (!) 215/92 (!) 159/74  Pulse: 76 82 95 76  Resp:  18 18 18   Temp:   99 F (37.2 C) 98.6 F (37 C)  TempSrc:   Oral Oral  SpO2: 98% 98% 98% 98%  Weight:   106.9 kg (235 lb 9.6 oz)   Height:        Intake/Output Summary (Last 24 hours) at 12/07/16 1601 Last data filed at 12/07/16 1543  Gross per 24 hour  Intake              940 ml  Output                0 ml  Net              940 ml   Filed Weights   12/06/16  1426 12/06/16 2041  Weight: 107.5 kg (237 lb) 106.9 kg (235 lb 9.6 oz)    Examination:  General exam: Alert, awake, oriented x 3 Respiratory system: Clear to auscultation. Respiratory effort normal. Cardiovascular system:RRR. No murmurs, rubs, gallops. Gastrointestinal system: Abdomen is nondistended, soft and nontender. No organomegaly or masses felt. Normal bowel sounds heard. Central nervous system: Alert and oriented. No focal neurological deficits. Extremities: 5 x 5 cm right anterior lower leg ulceration with purulent drainage  Skin: No rashes, lesions or ulcers Psychiatry: Judgement and insight appear normal. Mood & affect appropriate.     Data Reviewed: I have personally reviewed following labs and imaging studies  CBC:  Recent Labs Lab 12/06/16 1449  WBC 9.3  NEUTROABS 6.1  HGB 14.6  HCT 42.5  MCV 92.8  PLT [id number]   Basic Metabolic Panel:  Recent Labs Lab 12/06/16 1449 12/06/16 1501 12/07/16 0504  NA 138  --  139  K 3.4*  --  3.8  CL 101  --  102  CO2 28  --  28  GLUCOSE 101*   --  104*  BUN 13  --  13  CREATININE 0.71  --  0.77  CALCIUM 9.3  --  8.7*  MG  --  2.0  --    GFR: Estimated Creatinine Clearance: 85.4 mL/min (by C-G formula based on SCr of 0.77 mg/dL). Liver Function Tests: No results for input(s): AST, ALT, ALKPHOS, BILITOT, PROT, ALBUMIN in the last 168 hours. No results for input(s): LIPASE, AMYLASE in the last 168 hours. No results for input(s): AMMONIA in the last 168 hours. Coagulation Profile: No results for input(s): INR, PROTIME in the last 168 hours. Cardiac Enzymes: No results for input(s): CKTOTAL, CKMB, CKMBINDEX, TROPONINI in the last 168 hours. BNP (last 3 results) No results for input(s): PROBNP in the last 8760 hours. HbA1C: No results for input(s): HGBA1C in the last 72 hours. CBG: No results for input(s): GLUCAP in the last 168 hours. Lipid Profile: No results for input(s): CHOL, HDL, LDLCALC, TRIG, CHOLHDL, LDLDIRECT in the last 72 hours. Thyroid Function Tests: No results for input(s): TSH, T4TOTAL, FREET4, T3FREE, THYROIDAB in the last 72 hours. Anemia Panel: No results for input(s): VITAMINB12, FOLATE, FERRITIN, TIBC, IRON, RETICCTPCT in the last 72 hours. Urine analysis: No results found for: COLORURINE, APPEARANCEUR, LABSPEC, PHURINE, GLUCOSEU, HGBUR, BILIRUBINUR, KETONESUR, PROTEINUR, UROBILINOGEN, NITRITE, LEUKOCYTESUR Sepsis Labs: @LABRCNTIP (procalcitonin:4,lacticidven:4)  ) Recent Results (from the past 240 hour(s))  Wound or Superficial Culture     Status: None (Preliminary result)   Collection Time: 12/06/16  3:00 PM  Result Value Ref Range Status   Specimen Description LEG RIGHT  Final   Special Requests Normal  Final   Gram Stain   Final    WBC PRESENT,BOTH PMN AND MONONUCLEAR FEW GRAM NEGATIVE RODS MODERATE GRAM POSITIVE COCCI IN PAIRS    Culture   Final    CULTURE REINCUBATED FOR BETTER GROWTH Performed at [facility], [place] [address] [place], [place] [address]    Report Status PENDING   Incomplete         Radiology Studies: Dg Tibia/fibula Right  Result Date: 12/06/2016 CLINICAL DATA:  Right lower leg wound with redness and swelling. EXAM: RIGHT TIBIA AND FIBULA - 2 VIEW COMPARISON:  None. FINDINGS: There is soft tissue irregularity and a rounded lucency in the anterior lower leg at the level of the mid tibia presumably corresponding to the known it wound. Subcutaneous fat reticulation is present throughout the  lower leg. Swelling extends to the ankle. The tibia and fibula appear intact without evidence of fracture or erosion. Moderate marginal osteophytosis is present at the knee involving the medial and lateral compartments. Plantar and posterior calcaneal enthesophytes are present. A small well corticated ossicle projects adjacent to the medial malleolus. No dissecting soft tissue emphysema is identified. IMPRESSION: Cellulitic changes throughout the lower leg. No evidence of acute osseous abnormality. Electronically Signed   By: Logan Bores M.D.   On: 12/06/2016 15:46   Mr Tibia Fibula Right Wo Contrast  Result Date: 12/07/2016 CLINICAL DATA:  Open wound along the right shin for 3 weeks. Cellulitis. EXAM: MRI OF LOWER RIGHT EXTREMITY WITHOUT CONTRAST TECHNIQUE: Multiplanar, multisequence MR imaging of the right lower leg was performed. No intravenous contrast was administered. 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 Ligaments Not applicable Muscles and Tendons Symmetric musculature common no visible myositis. No gas in the muscular tissues or significant asymmetry of the calf musculature is identified. Soft tissues A wound along the shin anterior to the mid tibia is shown on image 29/6, with associated skin thickening and edema in the cutaneous and subcutaneous structures adjacent to this wound. Cutaneous thickening extends both superiorly and inferiorly from this wound. There is no drainable  abscess. Low-grade associated subcutaneous edema, mild cellulitis not excluded. Mild subcutaneous edema is present along the posterolateral margin of the right lateral malleolus and may be unrelated to the shin wound. IMPRESSION: 1. Cutaneous wound with both cutaneous thickening and subcutaneous edema along the anterior mid shin region, anterior to the tibia. Local cellulitis is not excluded, but no abnormal gas tracking in the soft tissues or findings of myositis, drainable abscess, or osteomyelitis. Electronically Signed   By: Van Clines M.D.   On: 12/07/2016 09:34        Scheduled Meds: . amLODipine  5 mg Oral Daily  . collagenase   Topical Daily  . enoxaparin (LOVENOX) injection  50 mg Subcutaneous Q24H  . hydrochlorothiazide  12.5 mg Oral Daily  . lisinopril  20 mg Oral BID  . potassium chloride  40 mEq Oral BID  . vancomycin  750 mg Intravenous Q12H   Continuous Infusions:   LOS: 1 day    Time spent: 25 minutes . Greater than 50% of this time was spent in direct contact with the patient coordinating care.     Lelon Frohlich, MD [facility] Pager [phone]  If 7PM-7AM, please contact night-coverage [email or web address] Password [email or web address] 12/07/2016, 4:01 PM

## 2016-12-08 DIAGNOSIS — L03115 Cellulitis of right lower limb: Secondary | ICD-10-CM | POA: Diagnosis not present

## 2016-12-08 LAB — BASIC METABOLIC PANEL
ANION GAP: 6 (ref 5–15)
BUN: 12 mg/dL (ref 6–20)
CO2: 28 mmol/L (ref 22–32)
Calcium: 9 mg/dL (ref 8.9–10.3)
Chloride: 104 mmol/L (ref 101–111)
Creatinine, Ser: 0.89 mg/dL (ref 0.44–1.00)
GFR calc Af Amer: >60 mL/min (ref >60)
GFR calc non Af Amer: >60 mL/min (ref >60)
GLUCOSE: 119 mg/dL — AB (ref 65–99)
POTASSIUM: 4.3 mmol/L (ref 3.5–5.1)
Sodium: 138 mmol/L (ref 135–145)

## 2016-12-08 LAB — CBC
HEMATOCRIT: 40.5 % (ref 36.0–46.0)
HEMOGLOBIN: 13.7 g/dL (ref 12.0–15.0)
MCH: 31.7 pg (ref 26.0–34.0)
MCHC: 33.8 g/dL (ref 30.0–36.0)
MCV: 93.8 fL (ref 78.0–100.0)
Platelets: 302 10*3/uL (ref 150–400)
RBC: 4.32 MIL/uL (ref 3.87–5.11)
RDW: 12.1 % (ref 11.5–15.5)
WBC: 8.2 10*3/uL (ref 4.0–10.5)

## 2016-12-08 LAB — HEMOGLOBIN A1C
Hgb A1c MFr Bld: 6.5 % — ABNORMAL HIGH (ref 4.8–5.6)
MEAN PLASMA GLUCOSE: 140 mg/dL

## 2016-12-08 MED ORDER — DOXYCYCLINE HYCLATE 100 MG PO CAPS: 100.0000 mg | ORAL_CAPSULE | Freq: Two times a day (BID) | ORAL | 0 refills | Status: DC

## 2016-12-08 MED ORDER — HYDROCODONE-ACETAMINOPHEN 5-325 MG PO TABS: 1.0000 | ORAL_TABLET | ORAL | 0 refills | Status: DC | PRN

## 2016-12-08 NOTE — Progress Notes (Signed)
IV removed, site WNL.  Pt given d/c instructions and new prescriptions.  Discussed all home medications, pain and antibiotic (when, how, and why to take), patient verbalizes understanding. Discussed home care with patient, teachback completed. F/U appointment in place for wound f/u and primary care MD, pt states they will keep appointment. Pt is stable at this time.

## 2016-12-08 NOTE — Care Management Note (Addendum)
Case Management Note  Patient Details  Name: Crystal Coleman MRN: [id number] Date of Birth: 01-31-1956    Expected Discharge Date:  12/08/16               Expected Discharge Plan:  Home/Self Care (with OP wound clinic at [facility])  In-House Referral:  NA  Discharge planning Services  CM Consult  Post Acute Care Choice:    Choice offered to:  Patient  DME Arranged:    DME Agency:     HH Arranged:    [place] Agency:     Status of Service:  Completed, signed off  If discussed at [employer] of Stay Meetings, dates discussed:    Additional Comments: Patient discharging today. CM made appointment for patient at [facility] for wound care. Appointment made for next Tuesday January 30th at 0930. CM called [facility] to see if patient qualifies for [facility] RN for wound care until wound clinic appointment. Vickie of [facility] will let CM know if possible.   Later entry 1300: Patient will not qualify for [facility] RN due to not being homebound, patient states she will keep her appt at [facility] and have nursing show her husband how to dress wound prior to discharge.  Rakeb Kibble, Chauncey Reading, RN 12/08/2016, 11:42 AM

## 2016-12-08 NOTE — Discharge Instructions (Signed)
Appointment for Wound Clinic next Tuesday Jan. 30th at [place] at [place]

## 2016-12-08 NOTE — Discharge Summary (Signed)
Physician Discharge Summary  Crystal Coleman [id number] DOB: [birth year] DOA: 12/06/2016  PCP: Jani Gravel, MD  Admit date: 12/06/2016 Discharge date: 12/08/2016  Time spent: 45 minutes  Recommendations for Outpatient Follow-up:  -Will be discharged home today. -Advised to follow up with PCP in 2 weeks. -Will ask CM to arrange follow up at wound care center. -Will complete a 10 day course of doxycycline.   Discharge Diagnoses:  Principal Problem:   Cellulitis of leg, right Active Problems:   Ulcer of right leg (HCC)   HTN (hypertension)   Lower extremity edema   Discharge Condition: Stable and improved  Filed Weights   12/06/16 1426 12/06/16 2041  Weight: 107.5 kg (237 lb) 106.9 kg (235 lb 9.6 oz)    History of present illness:  As per Dr. Denton Brick on 1/24: Crystal Coleman  is a 61 y.o. female, with PMH- HTN, chronic lower extremity edema. Presented with Complaints of right lower extremity redness,warmth and pain that started 1 week ago.   Prior to that she had an ulcer mid leg, started about 1-2 months ago. She is not sure about how ulcer started, but she denies trauma or injury. She says the compression stockings she wore and over-the-counter wound gel (from [facility]) she was applying has gradually made it worse.  She denies fever, malaise, and vomiting. No prior similar ulcers, not diagnosed with DM.   In the ED- vitals were stable, temp- 98.5 F, Xray leg- No acute osseous abnormality, WBC- 9.3, Lactic acid- 1.9. Pt was given  Clindamycin in the ED, hospitalist called for further evaluation.   Hospital Course:   Right lower extremity cellulitis -Likely emanating from Ulcer, patient states she had a small lesion that got worse with her compression stockings. -She has not had fevers or leukocytosis.  -Will discharge on a 10 day course of doxycyline. -MRI without evidence for abscess or myositis or any other drainable lesion. -Will need follow up with wound  clinic.  Procedures:  None   Consultations:  None  Discharge Instructions  Discharge Instructions    Diet - low sodium heart healthy    Complete by:  As directed    Face-to-face encounter (required for Medicare/Medicaid patients)    Complete by:  As directed    I HERNANDEZ ACOSTA,Glennys Schorsch certify that this patient is under my care and that I, or a nurse practitioner or physician's assistant working with me, had a face-to-face encounter that meets the physician face-to-face encounter requirements with this patient on 12/08/2016. The encounter with the patient was in whole, or in part for the following medical condition(s) which is the primary reason for home health care (List medical condition): right leg wound   The encounter with the patient was in whole, or in part, for the following medical condition, which is the primary reason for home health care:  right leg wound   I certify that, based on my findings, the following services are medically necessary home health services:  Nursing   Reason for Medically Necessary Home Health Services:  Skilled Nursing- Complex Wound Care   My clinical findings support the need for the above services:  Unable to leave home safely without assistance and/or assistive device   Further, I certify that my clinical findings support that this patient is homebound due to:  Unable to leave home safely without assistance   Home Health    Complete by:  As directed    To provide the following care/treatments:  RN  Increase activity slowly    Complete by:  As directed      Allergies as of 12/08/2016      Reactions   Penicillins    Has patient had a PCN reaction causing immediate rash, facial/tongue/throat swelling, SOB or lightheadedness with hypotension: Yes Has patient had a PCN reaction causing severe rash involving mucus membranes or skin necrosis: No Has patient had a PCN reaction that required hospitalization No Has patient had a PCN reaction occurring  within the last 10 years: No If all of the above answers are "NO", then may proceed with Cephalosporin use.      Medication List    STOP taking these medications   furosemide 20 MG tablet Commonly known as:  LASIX   triamcinolone cream 0.1 % Commonly known as:  KENALOG     TAKE these medications   amLODipine 5 MG tablet Commonly known as:  NORVASC Take 5 mg by mouth daily.   doxycycline 100 MG capsule Commonly known as:  VIBRAMYCIN Take 1 capsule (100 mg total) by mouth 2 (two) times daily.   hydrochlorothiazide 12.5 MG capsule Commonly known as:  MICROZIDE Take 12.5 mg by mouth daily.   HYDROcodone-acetaminophen 5-325 MG tablet Commonly known as:  NORCO/VICODIN Take 1 tablet by mouth every 4 (four) hours as needed for moderate pain.   ibuprofen 200 MG tablet Commonly known as:  ADVIL,MOTRIN Take 200 mg by mouth as needed for moderate pain.   oxymetazoline 0.05 % nasal spray Commonly known as:  AFRIN Place 2 sprays into both nostrils as needed for congestion.   quinapril 20 MG tablet Commonly known as:  ACCUPRIL Take 20 mg by mouth 2 (two) times daily.      Allergies  Allergen Reactions  . Penicillins     Has patient had a PCN reaction causing immediate rash, facial/tongue/throat swelling, SOB or lightheadedness with hypotension: Yes Has patient had a PCN reaction causing severe rash involving mucus membranes or skin necrosis: No Has patient had a PCN reaction that required hospitalization No Has patient had a PCN reaction occurring within the last 10 years: No If all of the above answers are "NO", then may proceed with Cephalosporin use.    Follow-up Information    Jani Gravel, MD. Schedule an appointment as soon as possible for a visit in 2 week(s).   Specialty:  Internal Medicine Contact information: [address] [place] [address] [phone]            The results of significant diagnostics from this hospitalization (including imaging,  microbiology, ancillary and laboratory) are listed below for reference.    Significant Diagnostic Studies: Dg Tibia/fibula Right  Result Date: 12/06/2016 CLINICAL DATA:  Right lower leg wound with redness and swelling. EXAM: RIGHT TIBIA AND FIBULA - 2 VIEW COMPARISON:  None. FINDINGS: There is soft tissue irregularity and a rounded lucency in the anterior lower leg at the level of the mid tibia presumably corresponding to the known it wound. Subcutaneous fat reticulation is present throughout the lower leg. Swelling extends to the ankle. The tibia and fibula appear intact without evidence of fracture or erosion. Moderate marginal osteophytosis is present at the knee involving the medial and lateral compartments. Plantar and posterior calcaneal enthesophytes are present. A small well corticated ossicle projects adjacent to the medial malleolus. No dissecting soft tissue emphysema is identified. IMPRESSION: Cellulitic changes throughout the lower leg. No evidence of acute osseous abnormality. Electronically Signed   By: Seymour Bars.D.  On: 12/06/2016 15:46   Mr Tibia Fibula Right Wo Contrast  Result Date: 12/07/2016 CLINICAL DATA:  Open wound along the right shin for 3 weeks. Cellulitis. EXAM: MRI OF LOWER RIGHT EXTREMITY WITHOUT CONTRAST TECHNIQUE: Multiplanar, multisequence MR imaging of the right lower leg was performed. No intravenous contrast was administered. 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 Ligaments Not applicable Muscles and Tendons Symmetric musculature common no visible myositis. No gas in the muscular tissues or significant asymmetry of the calf musculature is identified. Soft tissues A wound along the shin anterior to the mid tibia is shown on image 29/6, with associated skin thickening and edema in the cutaneous and subcutaneous structures adjacent to this wound. Cutaneous thickening extends both  superiorly and inferiorly from this wound. There is no drainable abscess. Low-grade associated subcutaneous edema, mild cellulitis not excluded. Mild subcutaneous edema is present along the posterolateral margin of the right lateral malleolus and may be unrelated to the shin wound. IMPRESSION: 1. Cutaneous wound with both cutaneous thickening and subcutaneous edema along the anterior mid shin region, anterior to the tibia. Local cellulitis is not excluded, but no abnormal gas tracking in the soft tissues or findings of myositis, drainable abscess, or osteomyelitis. Electronically Signed   By: Van Clines M.D.   On: 12/07/2016 09:34    Microbiology: Recent Results (from the past 240 hour(s))  Wound or Superficial Culture     Status: None (Preliminary result)   Collection Time: 12/06/16  3:00 PM  Result Value Ref Range Status   Specimen Description LEG RIGHT  Final   Special Requests Normal  Final   Gram Stain   Final    WBC PRESENT,BOTH PMN AND MONONUCLEAR FEW GRAM NEGATIVE RODS MODERATE GRAM POSITIVE COCCI IN PAIRS    Culture   Final    CULTURE REINCUBATED FOR BETTER GROWTH Performed at [facility], [place] [address], [place],  [address]    Report Status PENDING  Incomplete     Labs: Basic Metabolic Panel:  Recent Labs Lab 12/06/16 1449 12/06/16 1501 12/07/16 0504 12/08/16 0615  NA 138  --  139 138  K 3.4*  --  3.8 4.3  CL 101  --  102 104  CO2 28  --  28 28  GLUCOSE 101*  --  104* 119*  BUN 13  --  13 12  CREATININE 0.71  --  0.77 0.89  CALCIUM 9.3  --  8.7* 9.0  MG  --  2.0  --   --    Liver Function Tests: No results for input(s): AST, ALT, ALKPHOS, BILITOT, PROT, ALBUMIN in the last 168 hours. No results for input(s): LIPASE, AMYLASE in the last 168 hours. No results for input(s): AMMONIA in the last 168 hours. CBC:  Recent Labs Lab 12/06/16 1449 12/08/16 0615  WBC 9.3 8.2  NEUTROABS 6.1  --   HGB 14.6 13.7  HCT 42.5 40.5  MCV 92.8 93.8   PLT 314 302   Cardiac Enzymes: No results for input(s): CKTOTAL, CKMB, CKMBINDEX, TROPONINI in the last 168 hours. BNP: BNP (last 3 results) No results for input(s): BNP in the last 8760 hours.  ProBNP (last 3 results) No results for input(s): PROBNP in the last 8760 hours.  CBG: No results for input(s): GLUCAP in the last 168 hours.     SignedLelon Frohlich  [facility] Pager: [phone] 12/08/2016, 10:58 AM

## 2016-12-09 LAB — AEROBIC CULTURE W GRAM STAIN (SUPERFICIAL SPECIMEN): Special Requests: NORMAL

## 2016-12-09 LAB — AEROBIC CULTURE  (SUPERFICIAL SPECIMEN)

## 2016-12-12 ENCOUNTER — Ambulatory Visit (HOSPITAL_COMMUNITY): Payer: 59 | Attending: Internal Medicine | Admitting: Physical Therapy

## 2016-12-12 DIAGNOSIS — L97813 Non-pressure chronic ulcer of other part of right lower leg with necrosis of muscle: Secondary | ICD-10-CM | POA: Diagnosis present

## 2016-12-12 DIAGNOSIS — M79661 Pain in right lower leg: Secondary | ICD-10-CM | POA: Insufficient documentation

## 2016-12-12 NOTE — Therapy (Signed)
[place], [place], [address] Phone: [phone]   Fax:  [phone]  Wound Care Evaluation  Patient Details  Name: Crystal Coleman MRN: [id number] Date of Birth: [birth year] Referring Provider: Domingo Mend  Encounter Date: 12/12/2016      PT End of Session - 12/12/16 1216    Visit Number 1   Number of Visits 12   Date for PT Re-Evaluation 01/11/17   Authorization Type UHC   Authorization - Visit Number 1   Authorization - Number of Visits 12   PT Start Time 0950   PT Stop Time 1030   PT Time Calculation (min) 40 min   Behavior During Therapy [facility] for tasks assessed/performed      Past Medical History:  Diagnosis Date  . Arthritis   . Hypertension     Past Surgical History:  Procedure Laterality Date  . CHOLECYSTECTOMY      There were no vitals filed for this visit.        [facility] PT Assessment - 12/12/16 0001      Assessment   Medical Diagnosis non healing wound   Referring Provider Domingo Mend   Onset Date/Surgical Date 10/17/16   Next MD Visit 12/15/2016   Prior Therapy none     Precautions   Precautions None     Restrictions   Weight Bearing Restrictions No     Balance Screen   Has the patient fallen in the past 6 months No   Has the patient had a decrease in activity level because of a fear of falling?  No   Is the patient reluctant to leave their home because of a fear of falling?  No     Prior Function   Level of Independence Independent         Wound Therapy - 12/12/16 1200    Subjective Crystal Coleman states thta she has had a wound on her left leg for approximately a month.  The wound continued to progress so she went to the MD on Wednesday who sent her to the ER.  She was hospitalized and recieved IV antibiotics from 12/06/2016 thru 12/08/2016.  She is now being refrered to skilled PT for wound care.    Patient and Family Stated Goals Wound to be healed    Date of  Onset 10/17/16   Prior Treatments self care, Acute hospital stay.     Pain Assessment 0-10   Pain Score 2   increases with debridement.    Pain Type Acute pain   Pain Location Leg   Pain Orientation Right   Pain Descriptors / Indicators Aching   Pain Onset Gradual   Patients Stated Pain Goal 0   Pain Intervention(s) Emotional support   Multiple Pain Sites No   Wound Properties Date First Assessed: 12/12/16 Time First Assessed: 1021 Wound Type: Non-pressure wound Location: Leg Location Orientation: Right;Lower;Anterior Present on Admission: Yes   Dressing Type Other (Comment)  Santyl, 4x4, kelrix ,coban and netting   Dressing Changed Changed   Dressing Status Old drainage   Dressing Change Frequency PRN   Site / Wound Assessment Yellow   % Wound base Red or Granulating 0%   % Wound base Yellow/Fibrinous Exudate 100%   Peri-wound Assessment Edema;Erythema (blanchable)   Wound Length (cm) 4.5 cm   Wound Width (cm) 4.5 cm   Wound Depth (cm) 0.3 cm   Drainage Amount Moderate   Drainage Description Purulent   Treatment Cleansed;Debridement (  Selective);Hydrotherapy (Pulse lavage)   Pulsed lavage therapy - wound location --  entire wound base.    Pulsed Lavage with Suction (psi) 4 psi   Pulsed Lavage with Suction - Normal Saline Used 1000 mL   Pulsed Lavage Tip Tip with splash shield   Selective Debridement - Location entire wound bed   Selective Debridement - Tools Used Forceps;Scalpel   Selective Debridement - Tissue Removed slough    Wound Therapy - Clinical Statement Crystal Coleman is a 61 yo female who has an infected  non-healing wound on the anterior aspect of her right leg.  She was hospitalized from 1/24 thru 1/26 for IV antibiotics and is now being referred to skilled physcial therapy for wound care.  Evaluation demonstrates a wound with no granulation at this point with mild odor.  Crystal Coleman will benefit from skilled physical therapy for pulse lavage,debridement and dressing  change to create an environment conductive to healing.  We will use santyl,(pt will bring from home as this is what the MD ordered), once there is greater granulation santyl will be stopped. 1   Wound Therapy - Functional Problem List Pain with donning and doffng clothing    Factors Delaying/Impairing Wound Healing Infection - systemic/local   Hydrotherapy Plan Debridement;Dressing change;Patient/family education;Pulsatile lavage with suction   Wound Therapy - Frequency --  2 x a week    Wound Therapy - Current Recommendations OT   Wound Plan as above    Dressing  santyl, 4x4, kerlix, coban and netting                          PT Education - 12/12/16 1216    Education provided Yes   Education Details Keep dressing on.  If dressing becomes painful remove coban.    Person(s) Educated Patient   Methods Explanation   Comprehension Verbalized understanding          PT Short Term Goals - 12/12/16 1219      PT SHORT TERM GOAL #1   Title Pt wound to be 50% granulated to decrease risk of infection    Time 3   Period Weeks   Status New     PT SHORT TERM GOAL #2   Title Pt drainage to be decreased to minimum to decrease risk of infection    Time 3   Period Weeks   Status New     PT SHORT TERM GOAL #3   Title Pt to wound depth to decrease to .1 cm    Time 3   Period Weeks   Status New           PT Long Term Goals - 12/12/16 1258      PT LONG TERM GOAL #1   Title Pt wound to be 100% granulated to reduce risk of infection    Time 6   Period Weeks   Status New     PT LONG TERM GOAL #2   Title Pt wound size to be decreased to no greater than 2.5 cm squared to allow pt to feel confident in self care in preparation for discharge.    Time 6   Period Weeks   Status New              Plan - 12/12/16 1218    Clinical Impression Statement as above    Rehab Potential Good   PT Frequency 2x / week   PT Duration 6 weeks  PT Treatment/Interventions  ADLs/Self Care Home Management;Other (comment)  wound care   PT Next Visit Plan continue with pulse lavage, debridement and dressing change.    Consulted and Agree with Plan of Care Patient      Patient will benefit from skilled therapeutic intervention in order to improve the following deficits and impairments:     Visit Diagnosis: Pain in right lower leg  Ulcer of right pretibial region, with necrosis of muscle [facility])    Problem List Patient Active Problem List   Diagnosis Date Noted  . Cellulitis of leg, right 12/06/2016  . Ulcer of right leg ([place]) 12/06/2016  . HTN (hypertension) 12/06/2016  . Lower extremity edema 12/06/2016    Rayetta Humphrey, PT CLT [phone] 12/12/2016, 1:06 PM  [place] [address] [place], [place], [address] Phone: [phone]   Fax:  [phone]  Name: LANIYA MAZAK MRN: [id number] Date of Birth: Nov 13, 1956

## 2016-12-15 ENCOUNTER — Ambulatory Visit (HOSPITAL_COMMUNITY): Payer: 59 | Attending: Internal Medicine

## 2016-12-15 DIAGNOSIS — M79661 Pain in right lower leg: Secondary | ICD-10-CM | POA: Diagnosis not present

## 2016-12-15 DIAGNOSIS — L97813 Non-pressure chronic ulcer of other part of right lower leg with necrosis of muscle: Secondary | ICD-10-CM | POA: Insufficient documentation

## 2016-12-15 NOTE — Therapy (Signed)
[place], [place], [address] Phone: [phone]   Fax:  [phone]  Wound Care Therapy  Patient Details  Name: Crystal Coleman MRN: [id number] Date of Birth: 02/06/1956 Referring Provider: Domingo Mend  Encounter Date: 12/15/2016      PT End of Session - 12/15/16 1112    Visit Number 2   Number of Visits 12   Date for PT Re-Evaluation 01/11/17   Authorization Type UHC   Authorization - Visit Number 2   Authorization - Number of Visits 12   PT Start Time [id number]   PT Stop Time 1111   PT Time Calculation (min) 36 min   Activity Tolerance Patient tolerated treatment well;Patient limited by pain;No increased pain   Behavior During Therapy WFL for tasks assessed/performed      Past Medical History:  Diagnosis Date  . Arthritis   . Hypertension     Past Surgical History:  Procedure Laterality Date  . CHOLECYSTECTOMY      There were no vitals filed for this visit.       Subjective Assessment - 12/15/16 1239    Subjective Pt stated compression dressing too painful, had to remove outer coban dressings.  Arrived with gauze and netting, current pain scale 2/10 Rt LE   Currently in Pain? Yes   Pain Score 2    Pain Location Leg   Pain Orientation Right   Pain Descriptors / Indicators Aching   Pain Type Acute pain                   Wound Therapy - 12/15/16 1239    Subjective Pt stated compression dressing too painful, had to remove outer coban dressings.  Arrived with gauze and netting, current pain scale 2/10 Rt LE   Patient and Family Stated Goals Wound to be healed    Date of Onset 10/17/16   Prior Treatments self care, Acute hospital stay.     Pain Assessment 0-10   Pain Onset Gradual   Patients Stated Pain Goal 0   Pain Intervention(s) Emotional support   Multiple Pain Sites No   Wound Properties Date First Assessed: 12/12/16 Time First Assessed: 1021 Wound Type: Non-pressure  wound Location: Leg Location Orientation: Right;Lower;Anterior Present on Admission: Yes   Dressing Type --  Santly, 4x4, gauze, medipore tape and netting    Dressing Changed Changed   Dressing Status Old drainage   Dressing Change Frequency PRN   Site / Wound Assessment Yellow   % Wound base Red or Granulating 5%   % Wound base Yellow/Fibrinous Exudate 95%   Peri-wound Assessment Edema;Erythema (blanchable)   Drainage Amount Moderate   Drainage Description Purulent   Treatment Cleansed;Debridement (Selective)   Pulsed lavage therapy - wound location wound base   Pulsed Lavage with Suction (psi) 4 psi   Pulsed Lavage with Suction - Normal Saline Used 1000 mL   Pulsed Lavage Tip Tip with splash shield   Selective Debridement - Location entire wound bed   Selective Debridement - Tools Used Scalpel   Selective Debridement - Tissue Removed slough    Wound Therapy - Clinical Statement Pt arrived with gauze and netting dressings intact, reports coban too painful and had to remove outer dressings.  Noted mild odor, redness and some heat proximal wound.  Reports she is still on antibiotics.  PLS and selective debridement complete for removal of purulent slough to promote healing.  Continued wiht Santyl dressings and applied 4x4  with gauze and medipore tape to reduce dressings sliding down LE.  Pt reports increased pain during debridement though no increased pain at EOS.     Wound Therapy - Functional Problem List Pain with donning and doffng clothing    Factors Delaying/Impairing Wound Healing Infection - systemic/local   Hydrotherapy Plan Debridement;Dressing change;Patient/family education;Pulsatile lavage with suction   Wound Therapy - Frequency --  2x/ week   Wound Therapy - Current Recommendations PT   Wound Plan Continue with Santly per MD until improved granulation then appropraite dressings   Dressing  santyl, 4x4, kerlix, medipore tape, vaseline and netting                     PT Short Term Goals - 12/12/16 1219      PT SHORT TERM GOAL #1   Title Pt wound to be 50% granulated to decrease risk of infection    Time 3   Period Weeks   Status New     PT SHORT TERM GOAL #2   Title Pt drainage to be decreased to minimum to decrease risk of infection    Time 3   Period Weeks   Status New     PT SHORT TERM GOAL #3   Title Pt to wound depth to decrease to .1 cm    Time 3   Period Weeks   Status New           PT Long Term Goals - 12/12/16 1258      PT LONG TERM GOAL #1   Title Pt wound to be 100% granulated to reduce risk of infection    Time 6   Period Weeks   Status New     PT LONG TERM GOAL #2   Title Pt wound size to be decreased to no greater than 2.5 cm squared to allow pt to feel confident in self care in preparation for discharge.    Time 6   Period Weeks   Status New             Patient will benefit from skilled therapeutic intervention in order to improve the following deficits and impairments:     Visit Diagnosis: Pain in right lower leg  Ulcer of right pretibial region, with necrosis of muscle [facility])     Problem List Patient Active Problem List   Diagnosis Date Noted  . Cellulitis of leg, right 12/06/2016  . Ulcer of right leg ([place]) 12/06/2016  . HTN (hypertension) 12/06/2016  . Lower extremity edema 12/06/2016   Ihor Austin, LPTA; [place]  Aldona Lento 12/15/2016, 12:48 PM  [place] [address] [place], [place], [address] Phone: [phone]   Fax:  [phone]  Name: Crystal Coleman MRN: [id number] Date of Birth: 07-10-1956

## 2016-12-19 ENCOUNTER — Ambulatory Visit (HOSPITAL_COMMUNITY): Payer: 59 | Admitting: Physical Therapy

## 2016-12-19 DIAGNOSIS — L97813 Non-pressure chronic ulcer of other part of right lower leg with necrosis of muscle: Secondary | ICD-10-CM

## 2016-12-19 DIAGNOSIS — M79661 Pain in right lower leg: Secondary | ICD-10-CM | POA: Diagnosis not present

## 2016-12-19 NOTE — Therapy (Signed)
[place]rystal Coleman, [place], [address] Phone: [phone]   Fax:  [phone]  Wound Care Therapy  Patient Details  Name: Crystal Coleman MRN: [id number] Date of Birth: [birth year] Referring Provider: Domingo Mend  Encounter Date: 12/19/2016      PT End of Session - 12/19/16 1047    Visit Number 3   Number of Visits 12   Date for PT Re-Evaluation 01/11/17   Authorization Type UHC   Authorization - Visit Number 3   Authorization - Number of Visits 12   PT Start Time 0950   PT Stop Time 1020   PT Time Calculation (min) 30 min   Activity Tolerance Patient tolerated treatment well;Patient limited by pain;No increased pain   Behavior During Therapy WFL for tasks assessed/performed      Past Medical History:  Diagnosis Date  . Arthritis   . Hypertension     Past Surgical History:  Procedure Laterality Date  . CHOLECYSTECTOMY      There were no vitals filed for this visit.                  Wound Therapy - 12/19/16 1041    Subjective Pt states the bandage without the coban worked much better.  STates she has not removed or changed it.  Currenlty with itching around the wound but no pain unless we mess with it.   Patient and Family Stated Goals Wound to be healed    Date of Onset 10/17/16   Prior Treatments self care, Acute hospital stay.     Pain Assessment 0-10   Pain Score 2    Pain Type Acute pain   Pain Location Leg   Pain Orientation Right   Pain Descriptors / Indicators Aching   Pain Onset With Activity   Patients Stated Pain Goal 0   Pain Intervention(s) Medication (See eMAR);Elevated extremity;Rest   Multiple Pain Sites No   Wound Properties Date First Assessed: 12/12/16 Time First Assessed: 1021 Wound Type: Non-pressure wound Location: Leg Location Orientation: Right;Lower;Anterior Present on Admission: Yes   Dressing Type Other (Comment)  Santly, 4x4, gauze, medipore tape and  netting    Dressing Changed Changed   Dressing Status Old drainage   Dressing Change Frequency PRN   Site / Wound Assessment Yellow   % Wound base Red or Granulating 15%   % Wound base Yellow/Fibrinous Exudate 85%   Peri-wound Assessment Edema;Erythema (blanchable)   Drainage Amount Minimal   Drainage Description Serosanguineous   Treatment Cleansed;Debridement (Selective)   Pulsed lavage therapy - wound location wound base   Pulsed Lavage with Suction (psi) 4 psi   Pulsed Lavage with Suction - Normal Saline Used 1000 mL   Pulsed Lavage Tip Tip with splash shield   Selective Debridement - Location entire wound bed   Selective Debridement - Tools Used Scalpel   Selective Debridement - Tissue Removed slough    Wound Therapy - Clinical Statement Noted "heart shaped" wound with borders beggining to approximate.  cleansed wound and debrided edges minimally due to pain tolerance.  Cleansed and moisturized LE well due to itching and irritation.  continued with santyl dressing, 4X4 and kerlix.  Secured with #6 netting.  Pt tolerated well.   Wound Therapy - Functional Problem List Pain with donning and doffng clothing    Factors Delaying/Impairing Wound Healing Infection - systemic/local   Hydrotherapy Plan Debridement;Dressing change;Patient/family education;Pulsatile lavage with suction   Wound Therapy - Frequency --  2x/ week   Wound Therapy - Current Recommendations PT   Wound Plan Continue with Santly per MD until improved granulation then appropraite dressings.  Discontinue PL when pain tolerance improves and wound has improved granulation.   Dressing  santyl, 4x4, kerlix,vaseline and netting                    PT Short Term Goals - 12/12/16 1219      PT SHORT TERM GOAL #1   Title Pt wound to be 50% granulated to decrease risk of infection    Time 3   Period Weeks   Status New     PT SHORT TERM GOAL #2   Title Pt drainage to be decreased to minimum to decrease risk of  infection    Time 3   Period Weeks   Status New     PT SHORT TERM GOAL #3   Title Pt to wound depth to decrease to .1 cm    Time 3   Period Weeks   Status New           PT Long Term Goals - 12/12/16 1258      PT LONG TERM GOAL #1   Title Pt wound to be 100% granulated to reduce risk of infection    Time 6   Period Weeks   Status New     PT LONG TERM GOAL #2   Title Pt wound size to be decreased to no greater than 2.5 cm squared to allow pt to feel confident in self care in preparation for discharge.    Time 6   Period Weeks   Status New             Patient will benefit from skilled therapeutic intervention in order to improve the following deficits and impairments:     Visit Diagnosis: Pain in right lower leg  Ulcer of right pretibial region, with necrosis of muscle [facility])     Problem List Patient Active Problem List   Diagnosis Date Noted  . Cellulitis of leg, right 12/06/2016  . Ulcer of right leg ([place]) 12/06/2016  . HTN (hypertension) 12/06/2016  . Lower extremity edema 12/06/2016    Teena Irani, PTA/CLT [id number]  12/19/2016, 10:48 AM  [facility] [address] [place], [place], [address] Phone: [phone]   Fax:  [phone]  Name: Crystal Coleman MRN: [id number] Date of Birth: 07/08/1956

## 2016-12-22 ENCOUNTER — Ambulatory Visit (HOSPITAL_COMMUNITY): Payer: 59

## 2016-12-22 DIAGNOSIS — M79661 Pain in right lower leg: Secondary | ICD-10-CM | POA: Diagnosis not present

## 2016-12-22 DIAGNOSIS — L97813 Non-pressure chronic ulcer of other part of right lower leg with necrosis of muscle: Secondary | ICD-10-CM

## 2016-12-22 NOTE — Therapy (Signed)
[place], [place], [address] Phone: [phone]   Fax:  [phone]  Wound Care Therapy  Patient Details  Name: Crystal Coleman MRN: [id number] Date of Birth: 1956-11-03 Referring Provider: Domingo Mend  Encounter Date: 12/22/2016      PT End of Session - 12/22/16 1116    Visit Number 4   Number of Visits 12   Date for PT Re-Evaluation 01/11/17   Authorization Type UHC   Authorization - Visit Number 4   Authorization - Number of Visits 12   PT Start Time [id number]   PT Stop Time 1115   PT Time Calculation (min) 39 min   Activity Tolerance Patient tolerated treatment well;Patient limited by pain;No increased pain   Behavior During Therapy WFL for tasks assessed/performed      Past Medical History:  Diagnosis Date  . Arthritis   . Hypertension     Past Surgical History:  Procedure Laterality Date  . CHOLECYSTECTOMY      There were no vitals filed for this visit.       Subjective Assessment - 12/22/16 1225    Subjective Pt stated improved tolerance with dressings, no reports of pain at entrance.                   Wound Therapy - 12/22/16 1225    Subjective Pt stated improved tolerance with dressings, no reports of pain at entrance.   Patient and Family Stated Goals Wound to be healed    Date of Onset 10/17/16   Prior Treatments self care, Acute hospital stay.     Pain Assessment No/denies pain   Pain Score 0-No pain  Increased to 5/10 during PLS; EOS reduced to 2/10   Faces Pain Scale Hurts a little bit   Pain Type Acute pain   Pain Location Leg   Pain Orientation Right   Pain Descriptors / Indicators Aching   Pain Onset With Activity   Patients Stated Pain Goal 0   Pain Intervention(s) Medication (See eMAR);Elevated extremity;Rest   Multiple Pain Sites No   Evaluation and Treatment Procedures Explained to Patient/Family Yes   Evaluation and Treatment Procedures agreed to   Wound Properties Date First Assessed: 12/12/16 Time First Assessed: 1021 Wound Type: Non-pressure wound Location: Leg Location Orientation: Right;Lower;Anterior Present on Admission: Yes   Dressing Type Other (Comment)  Santyl, 4x4, gauze and netting   Dressing Changed Changed   Dressing Status Old drainage   Dressing Change Frequency PRN   Site / Wound Assessment Yellow   % Wound base Red or Granulating 20%   % Wound base Yellow/Fibrinous Exudate 80%   % Wound base Black/Eschar 0%   Drainage Amount Moderate   Drainage Description Serosanguineous   Non-staged Wound Description Full thickness   Treatment Cleansed;Debridement (Selective);Hydrotherapy (Pulse lavage)   Pulsed lavage therapy - wound location wound base   Pulsed Lavage with Suction (psi) 4 psi   Pulsed Lavage with Suction - Normal Saline Used 1000 mL   Pulsed Lavage Tip Tip with splash shield   Selective Debridement - Location entire wound bed   Selective Debridement - Tools Used Scalpel   Selective Debridement - Tissue Removed slough    Wound Therapy - Clinical Statement Continued with PLS and minimal selective debridement due to increased pain level.  Noted improved approximation distal portion of wound, proximal edges with slight undermining from 12:00-4:00.  Selective debridement to reduce that per pt. tolerance.  Continued with  Santly per MD with gauze and netting.  EOS pt reprts pain scale 2/10.     Wound Therapy - Functional Problem List Pain with donning and doffng clothing    Factors Delaying/Impairing Wound Healing Infection - systemic/local   Hydrotherapy Plan Debridement;Dressing change;Patient/family education;Pulsatile lavage with suction   Wound Therapy - Current Recommendations PT   Wound Plan Continue with Santly per MD until improved granulation then appropraite dressings.  Discontinue PL when pain tolerance improves and wound has improved granulation.   Dressing  santyl, 4x4, kerlix,vaseline and netting;  medipore tape to keep dressings from sliding down                   PT Short Term Goals - 12/12/16 1219      PT SHORT TERM GOAL #1   Title Pt wound to be 50% granulated to decrease risk of infection    Time 3   Period Weeks   Status New     PT SHORT TERM GOAL #2   Title Pt drainage to be decreased to minimum to decrease risk of infection    Time 3   Period Weeks   Status New     PT SHORT TERM GOAL #3   Title Pt to wound depth to decrease to .1 cm    Time 3   Period Weeks   Status New           PT Long Term Goals - 12/12/16 1258      PT LONG TERM GOAL #1   Title Pt wound to be 100% granulated to reduce risk of infection    Time 6   Period Weeks   Status New     PT LONG TERM GOAL #2   Title Pt wound size to be decreased to no greater than 2.5 cm squared to allow pt to feel confident in self care in preparation for discharge.    Time 6   Period Weeks   Status New             Patient will benefit from skilled therapeutic intervention in order to improve the following deficits and impairments:     Visit Diagnosis: Pain in right lower leg  Ulcer of right pretibial region, with necrosis of muscle [facility])     Problem List Patient Active Problem List   Diagnosis Date Noted  . Cellulitis of leg, right 12/06/2016  . Ulcer of right leg ([place]) 12/06/2016  . HTN (hypertension) 12/06/2016  . Lower extremity edema 12/06/2016   Ihor Austin, LPTA; [place]  Aldona Lento 12/22/2016, 12:34 PM  [facility] [address] [place], [place], [address] Phone: [phone]   Fax:  [phone]  Name: Crystal Coleman MRN: [id number] Date of Birth: 1956/01/26

## 2016-12-26 ENCOUNTER — Ambulatory Visit (HOSPITAL_COMMUNITY): Payer: 59

## 2016-12-26 DIAGNOSIS — M79661 Pain in right lower leg: Secondary | ICD-10-CM

## 2016-12-26 DIAGNOSIS — L97813 Non-pressure chronic ulcer of other part of right lower leg with necrosis of muscle: Secondary | ICD-10-CM

## 2016-12-26 NOTE — Therapy (Signed)
[place], [place], [address] Phone: [phone]   Fax:  [phone]  Wound Care Therapy  Patient Details  Name: Crystal Coleman MRN: [id number] Date of Birth: July 02, 1956 Referring Provider: Domingo Mend  Encounter Date: 12/26/2016      PT End of Session - 12/26/16 1420    Visit Number 5   Number of Visits 12   Date for PT Re-Evaluation 01/11/17   Authorization Type UHC   Authorization - Visit Number 5   Authorization - Number of Visits 12   PT Start Time [id number]   PT Stop Time 1418   PT Time Calculation (min) 30 min   Activity Tolerance Patient tolerated treatment well;Patient limited by pain;No increased pain   Behavior During Therapy WFL for tasks assessed/performed      Past Medical History:  Diagnosis Date  . Arthritis   . Hypertension     Past Surgical History:  Procedure Laterality Date  . CHOLECYSTECTOMY      There were no vitals filed for this visit.       Subjective Assessment - 12/26/16 1419    Subjective Pt stated pain is minimal today, c/o dressings sliding down LE   Currently in Pain? Yes   Pain Score 1    Pain Location Leg   Pain Orientation Right   Pain Descriptors / Indicators Throbbing   Pain Type Acute pain   Pain Frequency Intermittent                   Wound Therapy - 12/26/16 1419    Subjective Pt stated pain is minimal today, c/o dressings sliding down LE   Patient and Family Stated Goals Wound to be healed    Date of Onset 10/17/16   Prior Treatments self care, Acute hospital stay.     Pain Assessment 0-10   Pain Onset With Activity   Patients Stated Pain Goal 0   Pain Intervention(s) Medication (See eMAR);Elevated extremity;Rest   Multiple Pain Sites No   Evaluation and Treatment Procedures Explained to Patient/Family Yes   Evaluation and Treatment Procedures agreed to   Wound Properties Date First Assessed: 12/12/16 Time First Assessed: 1021  Wound Type: Non-pressure wound Location: Leg Location Orientation: Right;Lower;Anterior Present on Admission: Yes   Dressing Type Silver hydrofiber;Gauze (Comment)  silver hydrofiber, 4x4, gauze and netting size 5   Dressing Changed Changed   Dressing Status Old drainage   Dressing Change Frequency PRN   Site / Wound Assessment Yellow   % Wound base Red or Granulating 35%   % Wound base Yellow/Fibrinous Exudate 65%   Peri-wound Assessment Edema;Erythema (blanchable)   Wound Length (cm) 4.5 cm  was 4.5   Wound Width (cm) 4.5 cm  was 4.5   Wound Depth (cm) 0.3 cm  was .3   Drainage Amount Moderate   Drainage Description Serosanguineous   Non-staged Wound Description Full thickness   Treatment Cleansed;Debridement (Selective)   Pulsed lavage therapy - wound location d/c   Selective Debridement - Location entire wound bed   Selective Debridement - Tools Used Scalpel;Forceps   Selective Debridement - Tissue Removed slough    Wound Therapy - Clinical Statement Improved approximation and increased granulation, DC HEP this session and changed dressings to silverhydrofiber to address drainage.  Evaluation therapist aware of change.  Pt stated last day of antibiotics, minimal redness proximal incision, no odor and no heat.     Wound Therapy - Functional Problem List  Pain with donning and doffng clothing    Factors Delaying/Impairing Wound Healing Infection - systemic/local   Hydrotherapy Plan Debridement;Dressing change;Patient/family education;Pulsatile lavage with suction   Wound Therapy - Current Recommendations PT   Wound Plan Continie with appropriate dressings, DC PLS.   Dressing  silver hydrofiber, 4x4, gauze and netting size 5                   PT Short Term Goals - 12/12/16 1219      PT SHORT TERM GOAL #1   Title Pt wound to be 50% granulated to decrease risk of infection    Time 3   Period Weeks   Status New     PT SHORT TERM GOAL #2   Title Pt drainage to be  decreased to minimum to decrease risk of infection    Time 3   Period Weeks   Status New     PT SHORT TERM GOAL #3   Title Pt to wound depth to decrease to .1 cm    Time 3   Period Weeks   Status New           PT Long Term Goals - 12/12/16 1258      PT LONG TERM GOAL #1   Title Pt wound to be 100% granulated to reduce risk of infection    Time 6   Period Weeks   Status New     PT LONG TERM GOAL #2   Title Pt wound size to be decreased to no greater than 2.5 cm squared to allow pt to feel confident in self care in preparation for discharge.    Time 6   Period Weeks   Status New             Patient will benefit from skilled therapeutic intervention in order to improve the following deficits and impairments:     Visit Diagnosis: Pain in right lower leg  Ulcer of right pretibial region, with necrosis of muscle [facility])     Problem List Patient Active Problem List   Diagnosis Date Noted  . Cellulitis of leg, right 12/06/2016  . Ulcer of right leg ([place]) 12/06/2016  . HTN (hypertension) 12/06/2016  . Lower extremity edema 12/06/2016   Ihor Austin, LPTA; [place]  Aldona Lento 12/26/2016, 2:49 PM  [facility] [address] [place], [place], [address] Phone: [phone]   Fax:  [phone]  Name: Crystal Coleman MRN: [id number] Date of Birth: 03/27/1956

## 2016-12-28 ENCOUNTER — Ambulatory Visit (HOSPITAL_COMMUNITY): Payer: 59 | Admitting: Physical Therapy

## 2016-12-28 DIAGNOSIS — M79661 Pain in right lower leg: Secondary | ICD-10-CM | POA: Diagnosis not present

## 2016-12-28 DIAGNOSIS — L97813 Non-pressure chronic ulcer of other part of right lower leg with necrosis of muscle: Secondary | ICD-10-CM

## 2016-12-28 NOTE — Therapy (Signed)
[place], [place], [address] Phone: [phone]   Fax:  [phone]  Wound Care Therapy  Patient Details  Name: Crystal Coleman MRN: [id number] Date of Birth: [birth year] Referring Provider: Domingo Mend  Encounter Date: 12/28/2016      PT End of Session - 12/28/16 0948    Visit Number 6   Number of Visits 12   Date for PT Re-Evaluation 01/11/17   Authorization Type UHC   Authorization - Visit Number 6   Authorization - Number of Visits 12   PT Start Time 0905   PT Stop Time 0945   PT Time Calculation (min) 40 min   Activity Tolerance Patient tolerated treatment well;Patient limited by pain;No increased pain   Behavior During Therapy WFL for tasks assessed/performed      Past Medical History:  Diagnosis Date  . Arthritis   . Hypertension     Past Surgical History:  Procedure Laterality Date  . CHOLECYSTECTOMY      There were no vitals filed for this visit.                  Wound Therapy - 12/28/16 0941    Subjective Pt states the only time she has pain of any significance is with debridement.  But during debridment her pain is so high she can barely allow it to occur.    Patient and Family Stated Goals Wound to be healed    Date of Onset 10/17/16   Prior Treatments self care, Acute hospital stay.     Pain Assessment No/denies pain  At debridement pain is a 9/10   Evaluation and Treatment Procedures Explained to Patient/Family Yes   Evaluation and Treatment Procedures agreed to   Wound Properties Date First Assessed: 12/12/16 Time First Assessed: 1021 Wound Type: Non-pressure wound Location: Leg Location Orientation: Right;Lower;Anterior Present on Admission: Yes   Dressing Type Silver hydrofiber;Gauze (Comment)  silver hydrofiber, 4x4, gauze and netting size 5   Dressing Changed Changed   Dressing Status Old drainage   Dressing Change Frequency PRN   Site / Wound Assessment  Yellow   % Wound base Red or Granulating 40%   % Wound base Yellow/Fibrinous Exudate 60%   Peri-wound Assessment Edema;Erythema (blanchable)   Margins Epibole (rolled edges)   Drainage Amount Moderate   Drainage Description Serosanguineous   Non-staged Wound Description Full thickness   Treatment Cleansed;Debridement (Selective)   Pulsed lavage therapy - wound location d/c   Selective Debridement - Location entire wound bed   Selective Debridement - Tools Used Scalpel;Forceps   Selective Debridement - Tissue Removed slough    Wound Therapy - Clinical Statement Pt lateral border of wound epiboled.  Pt has better tolerance with blading than with using forceps.  Therapist requested pt to call MD to see if there was anything else that could be prescribed to control pain.     Wound Therapy - Functional Problem List Pt doing well with donning and doffing clothes at this time    Factors Delaying/Impairing Wound Healing Infection - systemic/local   Hydrotherapy Plan Debridement;Dressing change;Patient/family education;Pulsatile lavage with suction   Wound Therapy - Current Recommendations PT   Wound Plan Continie with appropriate dressings, DC PLS.   Dressing  cotton used to make a more cylindrical shape.  Dressing from MTP to knee.  Both of these were done to assist in stopping dressing from slipping as this is a complaint from pt.  Pt dressed with  cotton, silver hydrofiber, 4x4, gauze and netting size 5                   PT Short Term Goals - 12/28/16 0949      PT SHORT TERM GOAL #1   Title Pt wound to be 50% granulated to decrease risk of infection    Time 3   Period Weeks   Status On-going     PT SHORT TERM GOAL #2   Title Pt drainage to be decreased to minimum to decrease risk of infection    Time 3   Period Weeks   Status On-going     PT SHORT TERM GOAL #3   Title Pt to wound depth to decrease to .1 cm    Time 3   Period Weeks   Status On-going           PT  Long Term Goals - 12/28/16 0949      PT LONG TERM GOAL #1   Title Pt wound to be 100% granulated to reduce risk of infection    Time 6   Period Weeks   Status On-going     PT LONG TERM GOAL #2   Title Pt wound size to be decreased to no greater than 2.5 cm squared to allow pt to feel confident in self care in preparation for discharge.    Time 6   Period Weeks   Status On-going               Plan - 12/28/16 0948    Rehab Potential Good   PT Frequency 2x / week   PT Duration 6 weeks   PT Treatment/Interventions ADLs/Self Care Home Management;Other (comment)  wound care   PT Next Visit Plan cdebridement and dressing change.    Consulted and Agree with Plan of Care Patient      Patient will benefit from skilled therapeutic intervention in order to improve the following deficits and impairments:   (debridement and dressing change. )  Visit Diagnosis: Pain in right lower leg  Ulcer of right pretibial region, with necrosis of muscle [facility])     Problem List Patient Active Problem List   Diagnosis Date Noted  . Cellulitis of leg, right 12/06/2016  . Ulcer of right leg ([place]) 12/06/2016  . HTN (hypertension) 12/06/2016  . Lower extremity edema 12/06/2016    Rayetta Humphrey, PT CLT [phone] 12/28/2016, 9:50 AM  [facility] [address] [place], [place], [address] Phone: [phone]   Fax:  [phone]  Name: Crystal Coleman MRN: [id number] Date of Birth: 15-Aug-1956

## 2017-01-02 ENCOUNTER — Ambulatory Visit (HOSPITAL_COMMUNITY): Payer: 59

## 2017-01-02 DIAGNOSIS — L97813 Non-pressure chronic ulcer of other part of right lower leg with necrosis of muscle: Secondary | ICD-10-CM

## 2017-01-02 DIAGNOSIS — M79661 Pain in right lower leg: Secondary | ICD-10-CM | POA: Diagnosis not present

## 2017-01-02 NOTE — Therapy (Signed)
[place], [place], [address] Phone: [phone]   Fax:  [phone]  Physical Therapy Treatment  Patient Details  Name: Crystal Coleman MRN: [id number] Date of Birth: Oct 18, 1956 Referring Provider: Domingo Mend  Encounter Date: 01/02/2017      PT End of Session - 01/02/17 0944    Visit Number 7   Number of Visits 12   Date for PT Re-Evaluation 01/11/17   Authorization Type UHC   Authorization - Visit Number 7   Authorization - Number of Visits 12   PT Start Time 0905   PT Stop Time 0943   PT Time Calculation (min) 38 min   Activity Tolerance Patient tolerated treatment well;Patient limited by pain;No increased pain   Behavior During Therapy WFL for tasks assessed/performed      Past Medical History:  Diagnosis Date  . Arthritis   . Hypertension     Past Surgical History:  Procedure Laterality Date  . CHOLECYSTECTOMY      There were no vitals filed for this visit.      Subjective Assessment - 01/02/17 1025    Subjective Pt stated pain is minimal unless debridement.  Reports dressings continue to slide down LE   Currently in Pain? No/denies  Pain range .5-2/10 during and post debridement                       Wound Therapy - 01/02/17 1027    Subjective Pt stated pain is minimal unless debridement.  Reports dressings continue to slide down LE   Patient and Family Stated Goals Wound to be healed    Date of Onset 10/17/16   Prior Treatments self care, Acute hospital stay.     Pain Assessment No/denies pain   Pain Score --  Pain scale range .5-2/10 during and post   Faces Pain Scale Hurts a little bit   Pain Type Acute pain   Pain Location Leg   Pain Orientation Right;Anterior   Pain Descriptors / Indicators Throbbing   Pain Onset With Activity   Patients Stated Pain Goal 0   Pain Intervention(s) Medication (See eMAR);Elevated extremity;Rest   Multiple Pain Sites No   Evaluation and Treatment Procedures Explained to Patient/Family Yes   Evaluation and Treatment Procedures agreed to   Wound Properties Date First Assessed: 12/12/16 Time First Assessed: 1021 Wound Type: Non-pressure wound Location: Leg Location Orientation: Right;Lower;Anterior Present on Admission: Yes   Dressing Type Silver hydrofiber;Gauze (Comment)  silver hydrofiber, vaseline, 4x4, cotton, gauze, netting   Dressing Changed Changed   Dressing Status Old drainage   Dressing Change Frequency PRN   Site / Wound Assessment Yellow   % Wound base Red or Granulating 50%   % Wound base Yellow/Fibrinous Exudate 50%   % Wound base Black/Eschar 0%   Peri-wound Assessment Edema;Erythema (blanchable)   Margins Epibole (rolled edges)   Drainage Amount Moderate   Drainage Description Serosanguineous   Non-staged Wound Description Full thickness   Treatment Cleansed;Debridement (Selective)   Pulsed lavage therapy - wound location d/c   Selective Debridement - Location entire wound bed   Selective Debridement - Tools Used Scalpel   Selective Debridement - Tissue Removed slough    Wound Therapy - Clinical Statement Pt stated she has tried to call MD for pain control but has been unable to contact.  Pt presents with improved tolerance to debridement this session with pain scale range from .5-2/10 during session.  Continued  selective debridement for removal of slough, pt tolerates best with scalpel vs forceps during debridement.  Noted lateral border of wound epiboled.  Continued with silver hydrofiber to address drainage with additional cotton to assist with dressings sliding down LE.     Wound Therapy - Functional Problem List Pt doing well with donning and doffing clothes at this time    Factors Delaying/Impairing Wound Healing Infection - systemic/local   Hydrotherapy Plan Debridement;Dressing change;Patient/family education;Pulsatile lavage with suction   Wound Therapy - Frequency --  2x/ week    Wound Therapy - Current Recommendations PT   Wound Plan Continie with appropriate dressings, DC PLS.   Dressing  cotton used to make a more cylindrical shape.  Dressing from MTP to knee.  Both of these were done to assist in stopping dressing from slipping as this is a complaint from pt.  Pt dressed with cotton, silver hydrofiber, 4x4, gauze and netting size 5   Dressing silver hydrofiber, vaseline, 4x4, cotton, gauze, netting                    PT Short Term Goals - 12/28/16 0949      PT SHORT TERM GOAL #1   Title Pt wound to be 50% granulated to decrease risk of infection    Time 3   Period Weeks   Status On-going     PT SHORT TERM GOAL #2   Title Pt drainage to be decreased to minimum to decrease risk of infection    Time 3   Period Weeks   Status On-going     PT SHORT TERM GOAL #3   Title Pt to wound depth to decrease to .1 cm    Time 3   Period Weeks   Status On-going           PT Long Term Goals - 12/28/16 0949      PT LONG TERM GOAL #1   Title Pt wound to be 100% granulated to reduce risk of infection    Time 6   Period Weeks   Status On-going     PT LONG TERM GOAL #2   Title Pt wound size to be decreased to no greater than 2.5 cm squared to allow pt to feel confident in self care in preparation for discharge.    Time 6   Period Weeks   Status On-going             Patient will benefit from skilled therapeutic intervention in order to improve the following deficits and impairments:     Visit Diagnosis: Pain in right lower leg  Ulcer of right pretibial region, with necrosis of muscle [facility])     Problem List Patient Active Problem List   Diagnosis Date Noted  . Cellulitis of leg, right 12/06/2016  . Ulcer of right leg ([place]) 12/06/2016  . HTN (hypertension) 12/06/2016  . Lower extremity edema 12/06/2016   Ihor Austin, LPTA; [place]  Aldona Lento 01/02/2017, 2:54 PM  [facility] [address] [place], [place], [address] Phone: [phone]   Fax:  [phone]  Name: Crystal Coleman MRN: [id number] Date of Birth: [birth year]

## 2017-01-04 ENCOUNTER — Ambulatory Visit (HOSPITAL_COMMUNITY): Payer: 59 | Admitting: Physical Therapy

## 2017-01-04 DIAGNOSIS — L97813 Non-pressure chronic ulcer of other part of right lower leg with necrosis of muscle: Secondary | ICD-10-CM

## 2017-01-04 DIAGNOSIS — M79661 Pain in right lower leg: Secondary | ICD-10-CM | POA: Diagnosis not present

## 2017-01-04 NOTE — Therapy (Signed)
[place], [place], [address] Phone: [phone]   Fax:  [phone]  Wound Care Therapy  Patient Details  Name: Crystal Coleman MRN: [id number] Date of Birth: 1956-04-19 Referring Provider: Domingo Mend  Encounter Date: 01/04/2017      PT End of Session - 01/04/17 1334    Visit Number 8   Number of Visits 12   Date for PT Re-Evaluation 01/11/17   Authorization Type UHC   Authorization - Visit Number 8   Authorization - Number of Visits 12   PT Start Time 0822   PT Stop Time 0900   PT Time Calculation (min) 38 min   Activity Tolerance Patient tolerated treatment well;Patient limited by pain;No increased pain   Behavior During Therapy WFL for tasks assessed/performed      Past Medical History:  Diagnosis Date  . Arthritis   . Hypertension     Past Surgical History:  Procedure Laterality Date  . CHOLECYSTECTOMY      There were no vitals filed for this visit.                  Wound Therapy - 01/04/17 1329    Subjective Pt states her dressing continues to fall down.   Patient and Family Stated Goals Wound to be healed    Date of Onset 10/17/16   Prior Treatments self care, Acute hospital stay.     Pain Assessment No/denies pain   Evaluation and Treatment Procedures Explained to Patient/Family Yes   Evaluation and Treatment Procedures agreed to   Wound Properties Date First Assessed: 12/12/16 Time First Assessed: 1021 Wound Type: Non-pressure wound Location: Leg Location Orientation: Right;Lower;Anterior Present on Admission: Yes   Dressing Type Silver hydrofiber;Gauze (Comment)  silver hydrofiber, vaseline, 4x4, cotton, gauze, netting   Dressing Changed Changed   Dressing Status Old drainage   Dressing Change Frequency PRN   Site / Wound Assessment Yellow   % Wound base Red or Granulating 60%  following debridement   % Wound base Yellow/Fibrinous Exudate 40%   % Wound base  Black/Eschar 0%   Peri-wound Assessment Edema;Erythema (blanchable)   Wound Length (cm) 4 cm   Wound Width (cm) 4 cm   Wound Depth (cm) 0.3 cm   Margins Epibole (rolled edges)   Drainage Amount Moderate   Drainage Description Serosanguineous   Non-staged Wound Description Full thickness   Treatment Cleansed;Debridement (Selective)   Pulsed lavage therapy - wound location d/c   Selective Debridement - Location entire wound bed with concentration on the edges   Selective Debridement - Tools Used Scalpel   Selective Debridement - Tissue Removed slough    Wound Therapy - Clinical Statement Pt able to handle pain and debridment much better.  Dressing had slid down exposing superior aspect of wound.  Cut 1/2" foam this session and explained to patient how this will assist with keeping dressings up and provide more comfortable compression.  Pt was agreeable to try this with addition of coban.  Able to debride all wound borders with resulting 60% granulation.  Also measured wound with overall reduction of 0.5cm in both directions.  No signs/symptoms of infection.  Cleansed and moisturized well prior to rebandaging.  Continued with use of silver hydrofiber.   Wound Therapy - Functional Problem List Pt doing well with donning and doffing clothes at this time    Factors Delaying/Impairing Wound Healing Infection - systemic/local   Hydrotherapy Plan Debridement;Dressing change;Patient/family education;Pulsatile lavage with  suction   Wound Therapy - Frequency --  2x/ week   Wound Therapy - Current Recommendations PT   Wound Plan Continue with appropriate dressings,  Assess outcome of using foam next session.   Dressing  cotton used to make a more cylindrical shape.  Dressing from MTP to knee.  Both of these were done to assist in stopping dressing from slipping as this is a complaint from pt.  Pt dressed with cotton, silver hydrofiber, 4x4, gauze and netting size 5   Dressing silver hydrofiber, vaseline,  4x4, cotton, gauze, netting                   PT Short Term Goals - 12/28/16 0949      PT SHORT TERM GOAL #1   Title Pt wound to be 50% granulated to decrease risk of infection    Time 3   Period Weeks   Status On-going     PT SHORT TERM GOAL #2   Title Pt drainage to be decreased to minimum to decrease risk of infection    Time 3   Period Weeks   Status On-going     PT SHORT TERM GOAL #3   Title Pt to wound depth to decrease to .1 cm    Time 3   Period Weeks   Status On-going           PT Long Term Goals - 12/28/16 0949      PT LONG TERM GOAL #1   Title Pt wound to be 100% granulated to reduce risk of infection    Time 6   Period Weeks   Status On-going     PT LONG TERM GOAL #2   Title Pt wound size to be decreased to no greater than 2.5 cm squared to allow pt to feel confident in self care in preparation for discharge.    Time 6   Period Weeks   Status On-going             Patient will benefit from skilled therapeutic intervention in order to improve the following deficits and impairments:     Visit Diagnosis: Pain in right lower leg  Ulcer of right pretibial region, with necrosis of muscle [facility])     Problem List Patient Active Problem List   Diagnosis Date Noted  . Cellulitis of leg, right 12/06/2016  . Ulcer of right leg ([place]) 12/06/2016  . HTN (hypertension) 12/06/2016  . Lower extremity edema 12/06/2016    Teena Irani, PTA/CLT [phone]  01/04/2017, 1:35 PM  [place] [address] [place], [place], [address] Phone: [phone]   Fax:  [phone]  Name: Crystal Coleman MRN: [id number] Date of Birth: [birth year]

## 2017-01-09 ENCOUNTER — Ambulatory Visit (HOSPITAL_COMMUNITY): Payer: 59 | Admitting: Physical Therapy

## 2017-01-09 DIAGNOSIS — M79661 Pain in right lower leg: Secondary | ICD-10-CM

## 2017-01-09 DIAGNOSIS — L97813 Non-pressure chronic ulcer of other part of right lower leg with necrosis of muscle: Secondary | ICD-10-CM

## 2017-01-09 NOTE — Therapy (Signed)
[place], [place], [address] Phone: [phone]   Fax:  [phone]  Wound Care Therapy  Patient Details  Name: Crystal Coleman MRN: [id number] Date of Birth: Apr 16, 1956 Referring Provider: Domingo Mend  Encounter Date: 01/09/2017      PT End of Session - 01/09/17 0904    Visit Number 9   Number of Visits 12   Date for PT Re-Evaluation 01/11/17   Authorization Type UHC   Authorization - Visit Number 9   Authorization - Number of Visits 12   PT Start Time 0815   PT Stop Time [id number]   PT Time Calculation (min) 40 min   Activity Tolerance Patient tolerated treatment well;Patient limited by pain;No increased pain   Behavior During Therapy WFL for tasks assessed/performed      Past Medical History:  Diagnosis Date  . Arthritis   . Hypertension     Past Surgical History:  Procedure Laterality Date  . CHOLECYSTECTOMY      There were no vitals filed for this visit.                  Wound Therapy - 01/09/17 0900    Subjective Pt states that the foam worked very well keeping the dressing up .   Patient and Family Stated Goals Wound to be healed    Date of Onset 10/17/16   Prior Treatments self care, Acute hospital stay.     Pain Assessment No/denies pain   Evaluation and Treatment Procedures Explained to Patient/Family Yes   Evaluation and Treatment Procedures agreed to   Wound Properties Date First Assessed: 12/12/16 Time First Assessed: 1021 Wound Type: Non-pressure wound Location: Leg Location Orientation: Right;Lower;Anterior Present on Admission: Yes   Dressing Type Silver hydrofiber;Gauze (Comment)  silver hydrofiber, vaseline, 4x4, cotton, gauze, netting   Dressing Changed Changed   Dressing Status Old drainage   Dressing Change Frequency PRN   Site / Wound Assessment Yellow   % Wound base Red or Granulating 60%  following debridement   % Wound base Yellow/Fibrinous Exudate 40%    % Wound base Black/Eschar 0%   Peri-wound Assessment Edema;Erythema (blanchable)   Wound Length (cm) 4 cm   Wound Width (cm) 4 cm  minimal width is 1.5 wound is triangular shape    Wound Depth (cm) 0.2 cm   Margins Epibole (rolled edges)   Drainage Amount Moderate   Drainage Description Serous   Non-staged Wound Description Full thickness   Treatment Cleansed;Debridement (Selective)   Pulsed lavage therapy - wound location d/c   Selective Debridement - Location entire wound bed with concentration on the edges   Selective Debridement - Tools Used Scalpel   Selective Debridement - Tissue Removed slough    Wound Therapy - Clinical Statement Wound's depth has decreased.  Pt continues to tolerate debridement much better.     Wound Therapy - Functional Problem List --   Factors Delaying/Impairing Wound Healing Infection - systemic/local   Hydrotherapy Plan Debridement;Dressing change;Patient/family education   Wound Therapy - Frequency --  2x/ week   Wound Therapy - Current Recommendations PT   Wound Plan continue with debidement and compression dressing    Dressing  cotton used to make a more cylindrical shape.  Dressing from MTP to knee.  Both of these were done to assist in stopping dressing from slipping as this is a complaint from pt.  Pt dressed with cotton, silver hydrofiber, 4x4, gauze and netting size 5  Dressing silver hydrofiber, vaseline, 4x4, cotton, gauze, netting                   PT Short Term Goals - 12/28/16 0949      PT SHORT TERM GOAL #1   Title Pt wound to be 50% granulated to decrease risk of infection    Time 3   Period Weeks   Status On-going     PT SHORT TERM GOAL #2   Title Pt drainage to be decreased to minimum to decrease risk of infection    Time 3   Period Weeks   Status On-going     PT SHORT TERM GOAL #3   Title Pt to wound depth to decrease to .1 cm    Time 3   Period Weeks   Status On-going           PT Long Term Goals -  12/28/16 0949      PT LONG TERM GOAL #1   Title Pt wound to be 100% granulated to reduce risk of infection    Time 6   Period Weeks   Status On-going     PT LONG TERM GOAL #2   Title Pt wound size to be decreased to no greater than 2.5 cm squared to allow pt to feel confident in self care in preparation for discharge.    Time 6   Period Weeks   Status On-going               Plan - 01/09/17 [id number]    Clinical Impression Statement as above   Rehab Potential Good   PT Frequency 2x / week   PT Duration 6 weeks   PT Treatment/Interventions ADLs/Self Care Home Management;Other (comment)  wound care   PT Next Visit Plan debridement and dressing change.    Consulted and Agree with Plan of Care Patient      Patient will benefit from skilled therapeutic intervention in order to improve the following deficits and impairments:   (debridement and dressing change. )  Visit Diagnosis: Pain in right lower leg  Ulcer of right pretibial region, with necrosis of muscle [facility])     Problem List Patient Active Problem List   Diagnosis Date Noted  . Cellulitis of leg, right 12/06/2016  . Ulcer of right leg ([place]) 12/06/2016  . HTN (hypertension) 12/06/2016  . Lower extremity edema 12/06/2016    Rayetta Humphrey, PT CLT [phone] 01/09/2017, 9:06 AM  [facility] [address] [place], [place], [address] Phone: [phone]   Fax:  [phone]  Name: SONJI KROENKE MRN: [id number] Date of Birth: Oct 17, 1956

## 2017-01-11 ENCOUNTER — Ambulatory Visit (HOSPITAL_COMMUNITY): Payer: 59 | Attending: Internal Medicine | Admitting: Physical Therapy

## 2017-01-11 DIAGNOSIS — M79661 Pain in right lower leg: Secondary | ICD-10-CM | POA: Insufficient documentation

## 2017-01-11 DIAGNOSIS — L97813 Non-pressure chronic ulcer of other part of right lower leg with necrosis of muscle: Secondary | ICD-10-CM | POA: Diagnosis present

## 2017-01-11 NOTE — Therapy (Signed)
[place], [place], [address] Phone: [phone]   Fax:  [phone]  Wound Care Therapy  Patient Details  Name: EMAREE MANZELLA MRN: [id number] Date of Birth: Feb 18, 1956 Referring Provider: Domingo Mend  Encounter Date: 01/11/2017      PT End of Session - 01/11/17 0901    Visit Number 10   Number of Visits 12   Date for PT Re-Evaluation 01/11/17   Authorization Type UHC   Authorization - Visit Number 10   Authorization - Number of Visits 12   PT Start Time 0915   PT Stop Time 0952   PT Time Calculation (min) 37 min   Activity Tolerance Patient tolerated treatment well;Patient limited by pain;No increased pain   Behavior During Therapy WFL for tasks assessed/performed      Past Medical History:  Diagnosis Date  . Arthritis   . Hypertension     Past Surgical History:  Procedure Laterality Date  . CHOLECYSTECTOMY      There were no vitals filed for this visit.                  Wound Therapy - 01/11/17 0856    Subjective Pt states that the foam worked very well keeping the dressing up .   Patient and Family Stated Goals Wound to be healed    Date of Onset 10/17/16   Prior Treatments self care, Acute hospital stay.     Pain Assessment No/denies pain  only with debridement; can go as high as 8/10   Evaluation and Treatment Procedures Explained to Patient/Family Yes   Evaluation and Treatment Procedures agreed to   Wound Properties Date First Assessed: 12/12/16 Time First Assessed: 1021 Wound Type: Non-pressure wound Location: Leg Location Orientation: Right;Lower;Anterior Present on Admission: Yes   Dressing Type Silver hydrofiber;Gauze (Comment)  silver hydrofiber, vaseline, 4x4, cotton, gauze, netting   Dressing Changed Changed   Dressing Status Old drainage   Dressing Change Frequency PRN   Site / Wound Assessment Yellow   % Wound base Red or Granulating 65%  following  debridement   % Wound base Yellow/Fibrinous Exudate 35%   % Wound base Black/Eschar 0%   Peri-wound Assessment Edema;Erythema (blanchable)   Margins Epibole (rolled edges)   Drainage Amount Moderate   Drainage Description Serous   Non-staged Wound Description Full thickness   Treatment Cleansed;Debridement (Selective)   Pulsed lavage therapy - wound location d/c   Selective Debridement - Location entire wound bed with concentration on the edges   Selective Debridement - Tools Used Forceps;Scalpel   Selective Debridement - Tissue Removed slough    Wound Therapy - Clinical Statement Pt wound has slight increased granulation this session.  Dressing remains in place with foam.  Current plan remains appropriate.    Factors Delaying/Impairing Wound Healing Infection - systemic/local   Hydrotherapy Plan Debridement;Dressing change;Patient/family education   Wound Therapy - Frequency --  2x/ week   Wound Therapy - Current Recommendations PT   Wound Therapy - Follow Up Recommendations --  LE cleansed and moisturized prior to dressing .   Wound Plan continue with debidement and compression dressing    Dressing  cotton used to make a more cylindrical shape.  Dressing from MTP to knee using foam.   Both of these were done to assist in stopping dressing from slipping as this is a complaint from pt.  Pt dressed with cotton, silver hydrofiber, 4x4, gauze and netting size 5  Dressing silver hydrofiber, vaseline, 4x4, cotton, gauze,foam, gauze and  netting                   PT Short Term Goals - 12/28/16 0949      PT SHORT TERM GOAL #1   Title Pt wound to be 50% granulated to decrease risk of infection    Time 3   Period Weeks   Status On-going     PT SHORT TERM GOAL #2   Title Pt drainage to be decreased to minimum to decrease risk of infection    Time 3   Period Weeks   Status On-going     PT SHORT TERM GOAL #3   Title Pt to wound depth to decrease to .1 cm    Time 3   Period  Weeks   Status On-going           PT Long Term Goals - 12/28/16 0949      PT LONG TERM GOAL #1   Title Pt wound to be 100% granulated to reduce risk of infection    Time 6   Period Weeks   Status On-going     PT LONG TERM GOAL #2   Title Pt wound size to be decreased to no greater than 2.5 cm squared to allow pt to feel confident in self care in preparation for discharge.    Time 6   Period Weeks   Status On-going               Plan - 01/11/17 0902    Clinical Impression Statement as above    Rehab Potential Good   PT Frequency 2x / week   PT Duration 6 weeks   PT Treatment/Interventions ADLs/Self Care Home Management;Other (comment)  wound care   PT Next Visit Plan Remeasure wound    Consulted and Agree with Plan of Care Patient      Patient will benefit from skilled therapeutic intervention in order to improve the following deficits and impairments:   (debridement and dressing change. )  Visit Diagnosis: Pain in right lower leg  Ulcer of right pretibial region, with necrosis of muscle [facility])     Problem List Patient Active Problem List   Diagnosis Date Noted  . Cellulitis of leg, right 12/06/2016  . Ulcer of right leg ([place]) 12/06/2016  . HTN (hypertension) 12/06/2016  . Lower extremity edema 12/06/2016    Rayetta Humphrey, PT CLT [phone] 01/11/2017, 9:04 AM  [facility] [address] [place], [place], [address] Phone: [phone]   Fax:  [phone]  Name: FREDRICA SCHEIDEL MRN: [id number] Date of Birth: 1956-09-02

## 2017-01-16 ENCOUNTER — Ambulatory Visit (HOSPITAL_COMMUNITY): Payer: 59 | Admitting: Physical Therapy

## 2017-01-16 DIAGNOSIS — L97813 Non-pressure chronic ulcer of other part of right lower leg with necrosis of muscle: Secondary | ICD-10-CM

## 2017-01-16 DIAGNOSIS — M79661 Pain in right lower leg: Secondary | ICD-10-CM

## 2017-01-16 NOTE — Therapy (Signed)
[place], [place], [address] Phone: [phone]   Fax:  [phone]  Wound Care Therapy  Patient Details  Name: Crystal Coleman MRN: [id number] Date of Birth: [birth year] Referring Provider: Domingo Mend  Encounter Date: 01/16/2017      PT End of Session - 01/16/17 1217    Visit Number 11   Number of Visits 12   Date for PT Re-Evaluation 01/11/17   Authorization Type UHC, [place] [id number]   Authorization - Visit Number 11   Authorization - Number of Visits 12   PT Start Time 0815   PT Stop Time [id number]   PT Time Calculation (min) 40 min   Activity Tolerance Patient tolerated treatment well;Patient limited by pain;No increased pain   Behavior During Therapy WFL for tasks assessed/performed      Past Medical History:  Diagnosis Date  . Arthritis   . Hypertension     Past Surgical History:  Procedure Laterality Date  . CHOLECYSTECTOMY      There were no vitals filed for this visit.                  Wound Therapy - 01/16/17 1210    Subjective Pt states that the foam worked very well keeping the dressing up .   Patient and Family Stated Goals Wound to be healed    Date of Onset 10/17/16   Prior Treatments self care, Acute hospital stay.     Pain Assessment No/denies pain   Evaluation and Treatment Procedures Explained to Patient/Family Yes   Evaluation and Treatment Procedures agreed to   Wound Properties Date First Assessed: 12/12/16 Time First Assessed: 1021 Wound Type: Non-pressure wound Location: Leg Location Orientation: Right;Lower;Anterior Present on Admission: Yes   Dressing Type Silver hydrofiber;Gauze (Comment)  silver hydrofiber, vaseline, 4x4, cotton, gauze, netting   Dressing Changed Changed   Dressing Status Old drainage   Dressing Change Frequency PRN   Site / Wound Assessment Yellow   % Wound base Red or Granulating 70%  following debridement   % Wound base  Yellow/Fibrinous Exudate 30%   % Wound base Black/Eschar 0%   Peri-wound Assessment Edema;Erythema (blanchable)   Margins Unattached edges (unapproximated)   Drainage Amount Moderate   Drainage Description Serous   Non-staged Wound Description Full thickness   Treatment Cleansed;Debridement (Selective)   Pulsed lavage therapy - wound location d/c   Selective Debridement - Location entire wound bed with concentration on the edges   Selective Debridement - Tools Used Forceps;Scalpel   Selective Debridement - Tissue Removed slough    Wound Therapy - Clinical Statement continued improvement in granulation with more approximation of edges.  Moderate drainage continues with good compression obtained with bandages.  No pain or complaints.   Factors Delaying/Impairing Wound Healing Infection - systemic/local   Hydrotherapy Plan Debridement;Dressing change;Patient/family education   Wound Therapy - Frequency --  2x/ week   Wound Therapy - Current Recommendations PT   Wound Therapy - Follow Up Recommendations --  LE cleansed and moisturized prior to dressing .   Wound Plan continue with debidement and compression dressing    Dressing  cotton used to make a more cylindrical shape.  Dressing from MTP to knee using foam.   Both of these were done to assist in stopping dressing from slipping as this is a complaint from pt.  Pt dressed with cotton, silver hydrofiber, 4x4, gauze and netting size 5   Dressing silver hydrofiber, vaseline, 4x4,  cotton, gauze,foam, gauze and  netting                   PT Short Term Goals - 12/28/16 0949      PT SHORT TERM GOAL #1   Title Pt wound to be 50% granulated to decrease risk of infection    Time 3   Period Weeks   Status On-going     PT SHORT TERM GOAL #2   Title Pt drainage to be decreased to minimum to decrease risk of infection    Time 3   Period Weeks   Status On-going     PT SHORT TERM GOAL #3   Title Pt to wound depth to decrease to .1  cm    Time 3   Period Weeks   Status On-going           PT Long Term Goals - 12/28/16 0949      PT LONG TERM GOAL #1   Title Pt wound to be 100% granulated to reduce risk of infection    Time 6   Period Weeks   Status On-going     PT LONG TERM GOAL #2   Title Pt wound size to be decreased to no greater than 2.5 cm squared to allow pt to feel confident in self care in preparation for discharge.    Time 6   Period Weeks   Status On-going             Patient will benefit from skilled therapeutic intervention in order to improve the following deficits and impairments:     Visit Diagnosis: Pain in right lower leg  Ulcer of right pretibial region, with necrosis of muscle [facility])     Problem List Patient Active Problem List   Diagnosis Date Noted  . Cellulitis of leg, right 12/06/2016  . Ulcer of right leg ([place]) 12/06/2016  . HTN (hypertension) 12/06/2016  . Lower extremity edema 12/06/2016    Teena Irani, PTA/CLT [phone]  01/16/2017, 12:19 PM  [facility] [address] [place], [place], [address] Phone: [phone]   Fax:  [phone]  Name: Crystal Coleman MRN: [id number] Date of Birth: January 06, 1956

## 2017-01-18 ENCOUNTER — Ambulatory Visit (HOSPITAL_COMMUNITY): Payer: 59 | Admitting: Physical Therapy

## 2017-01-18 DIAGNOSIS — M79661 Pain in right lower leg: Secondary | ICD-10-CM | POA: Diagnosis not present

## 2017-01-18 DIAGNOSIS — L97813 Non-pressure chronic ulcer of other part of right lower leg with necrosis of muscle: Secondary | ICD-10-CM

## 2017-01-18 NOTE — Therapy (Signed)
[place], [place], [address] Phone: [phone]   Fax:  [phone]  Wound Care Therapy  Patient Details  Name: Crystal Coleman MRN: [id number] Date of Birth: 1956/02/02 Referring Provider: Domingo Mend  Encounter Date: 01/18/2017      PT End of Session - 01/18/17 1315    Visit Number 12   Number of Visits 12   Date for PT Re-Evaluation 01/11/17   Authorization Type UHC, [place] 2/29-7/98   Authorization - Visit Number 12   Authorization - Number of Visits 12   PT Start Time 0818   PT Stop Time 0856   PT Time Calculation (min) 38 min   Activity Tolerance Patient tolerated treatment well;Patient limited by pain;No increased pain   Behavior During Therapy WFL for tasks assessed/performed      Past Medical History:  Diagnosis Date  . Arthritis   . Hypertension     Past Surgical History:  Procedure Laterality Date  . CHOLECYSTECTOMY      There were no vitals filed for this visit.                  Wound Therapy - 01/18/17 1312    Subjective Pt states that the foam worked very well keeping the dressing up .   Patient and Family Stated Goals Wound to be healed    Date of Onset 10/17/16   Prior Treatments self care, Acute hospital stay.     Evaluation and Treatment Procedures Explained to Patient/Family Yes   Evaluation and Treatment Procedures agreed to   Wound Properties Date First Assessed: 12/12/16 Time First Assessed: 1021 Wound Type: Non-pressure wound Location: Leg Location Orientation: Right;Lower;Anterior Present on Admission: Yes   Dressing Type Silver hydrofiber;Gauze (Comment)  silver hydrofiber, vaseline, 4x4, cotton, gauze, netting   Dressing Changed Changed   Dressing Status Old drainage   Dressing Change Frequency PRN   Site / Wound Assessment Yellow   % Wound base Red or Granulating 90%  following debridement   % Wound base Yellow/Fibrinous Exudate 10%   % Wound base  Black/Eschar 0%   Peri-wound Assessment Edema;Erythema (blanchable)   Wound Length (cm) 3.4 cm  was 4 cm   Wound Width (cm) 3.7 cm  was 4 cm   Wound Depth (cm) 0.1 cm  was 0.3cm   Margins Unattached edges (unapproximated)   Drainage Amount Minimal   Drainage Description Serous   Non-staged Wound Description --   Treatment Cleansed;Debridement (Selective)   Pulsed lavage therapy - wound location d/c   Selective Debridement - Location entire wound bed with concentration on the edges   Selective Debridement - Tools Used Forceps;Scalpel   Selective Debridement - Tissue Removed slough    Wound Therapy - Clinical Statement Remeasured this session with overall improvement with decreased size, depth and approximating edges.  Wound now 90% granulated.  Pt is progressing well towards goals but continues to need skilled physical therapy to ensure proper healing and avoid infection.    Factors Delaying/Impairing Wound Healing Infection - systemic/local   Hydrotherapy Plan Debridement;Dressing change;Patient/family education   Wound Therapy - Frequency --  2x/ week continue for another 4 weeks    Wound Therapy - Current Recommendations PT   Wound Therapy - Follow Up Recommendations --  LE cleansed and moisturized prior to dressing .   Wound Plan Pt wound has improved greatly.  She will need continued skilled care to continue with debridement and compression dressing  Dressing  cotton used to make a more cylindrical shape.  Dressing from MTP to knee using foam.   Both of these were done to assist in stopping dressing from slipping as this is a complaint from pt.  Pt dressed with cotton, silver hydrofiber, 4x4, gauze and netting size 5   Dressing silver hydrofiber, vaseline, 4x4, cotton, gauze,foam, gauze and  netting                   PT Short Term Goals - 01/18/17 1316      PT SHORT TERM GOAL #1   Title Pt wound to be 50% granulated to decrease risk of infection    Time 3   Period  Weeks   Status Achieved     PT SHORT TERM GOAL #2   Title Pt drainage to be decreased to minimum to decrease risk of infection    Time 3   Period Weeks   Status Achieved     PT SHORT TERM GOAL #3   Title Pt to wound depth to decrease to .1 cm    Time 3   Period Weeks   Status Achieved           PT Long Term Goals - 01/18/17 1317      PT LONG TERM GOAL #1   Title Pt wound to be 100% granulated to reduce risk of infection    Time 6   Period Weeks   Status On-going     PT LONG TERM GOAL #2   Title Pt wound size to be decreased to no greater than 2.5 cm squared to allow pt to feel confident in self care in preparation for discharge.    Time 6   Period Weeks   Status On-going             Patient will benefit from skilled therapeutic intervention in order to improve the following deficits and impairments:     Visit Diagnosis: Pain in right lower leg  Ulcer of right pretibial region, with necrosis of muscle [facility])     Problem List Patient Active Problem List   Diagnosis Date Noted  . Cellulitis of leg, right 12/06/2016  . Ulcer of right leg ([place]) 12/06/2016  . HTN (hypertension) 12/06/2016  . Lower extremity edema 12/06/2016    Teena Irani, PTA/CLT [place], PT CLT [phone] 01/18/2017, 1:17 PM  [place] [address] [place], [place], [address] Phone: [phone]   Fax:  [phone]  Name: Crystal Coleman MRN: [id number] Date of Birth: 06-17-1956

## 2017-01-22 NOTE — Addendum Note (Signed)
Addended by: Leeroy Cha on: 01/22/2017 08:21 AM   Modules accepted: Orders

## 2017-01-25 ENCOUNTER — Ambulatory Visit (HOSPITAL_COMMUNITY): Payer: 59 | Admitting: Physical Therapy

## 2017-01-25 DIAGNOSIS — M79661 Pain in right lower leg: Secondary | ICD-10-CM | POA: Diagnosis not present

## 2017-01-25 DIAGNOSIS — L97813 Non-pressure chronic ulcer of other part of right lower leg with necrosis of muscle: Secondary | ICD-10-CM

## 2017-01-25 NOTE — Therapy (Signed)
[place], [place], [address] Phone: [phone]   Fax:  [phone]  Wound Care Therapy  Patient Details  Name: Crystal Coleman MRN: [id number] Date of Birth: [birth year] Referring Provider: Domingo Mend  Encounter Date: 01/25/2017    Past Medical History:  Diagnosis Date  . Arthritis   . Hypertension     Past Surgical History:  Procedure Laterality Date  . CHOLECYSTECTOMY      There were no vitals filed for this visit.                  Wound Therapy - 01/25/17 1225    Subjective Pt reports continued comfort with bandages and states it doesn't hurt or bother her at all.   Patient and Family Stated Goals Wound to be healed    Date of Onset 10/17/16   Prior Treatments self care, Acute hospital stay.     Pain Assessment No/denies pain   Evaluation and Treatment Procedures Explained to Patient/Family Yes   Evaluation and Treatment Procedures agreed to   Wound Properties Date First Assessed: 12/12/16 Time First Assessed: 1021 Wound Type: Non-pressure wound Location: Leg Location Orientation: Right;Lower;Anterior Present on Admission: Yes   Dressing Type Silver hydrofiber;Gauze (Comment)  silver hydrofiber, vaseline, 4x4, cotton, gauze, netting   Dressing Changed Changed   Dressing Status Old drainage   Dressing Change Frequency PRN   Site / Wound Assessment Yellow   % Wound base Red or Granulating 95%  following debridement   % Wound base Yellow/Fibrinous Exudate 5%   % Wound base Black/Eschar 0%   Peri-wound Assessment Edema;Erythema (blanchable)   Margins Attached edges (approximated)   Drainage Amount Minimal   Drainage Description Serous   Treatment Cleansed;Debridement (Selective)   Pulsed lavage therapy - wound location --   Selective Debridement - Location entire wound bed with concentration on the edges   Selective Debridement - Tools Used Forceps;Scalpel   Selective  Debridement - Tissue Removed slough    Wound Therapy - Clinical Statement continued approximation of wound this session.  Able to remove remaining dryskin and slough from borders to further promote approximation. Pt with little discomfort this session with debridment.  Changed dressing to xeroform due to reduced drainage and slough.  continued with foam and coban.   Factors Delaying/Impairing Wound Healing Infection - systemic/local   Hydrotherapy Plan Debridement;Dressing change;Patient/family education   Wound Therapy - Frequency --  2x/ week   Wound Therapy - Current Recommendations PT   Wound Therapy - Follow Up Recommendations --  LE cleansed and moisturized prior to dressing .   Wound Plan continue with debridement and compression dressing.  Assess how xeroform did next session.  Remeasure for progress.   Dressing  cotton used to make a more cylindrical shape.  Dressing from MTP to knee using foam.   Both of these were done to assist in stopping dressing from slipping as this is a complaint from pt.  Pt dressed with cotton, silver hydrofiber, 4x4, gauze and netting size 5   Dressing xeroform, vaseline, 4x4, cotton, gauze,foam, gauze and  netting                   PT Short Term Goals - 01/18/17 1316      PT SHORT TERM GOAL #1   Title Pt wound to be 50% granulated to decrease risk of infection    Time 3   Period Weeks   Status Achieved  PT SHORT TERM GOAL #2   Title Pt drainage to be decreased to minimum to decrease risk of infection    Time 3   Period Weeks   Status Achieved     PT SHORT TERM GOAL #3   Title Pt to wound depth to decrease to .1 cm    Time 3   Period Weeks   Status Achieved           PT Long Term Goals - 01/18/17 1317      PT LONG TERM GOAL #1   Title Pt wound to be 100% granulated to reduce risk of infection    Time 6   Period Weeks   Status On-going     PT LONG TERM GOAL #2   Title Pt wound size to be decreased to no greater than  2.5 cm squared to allow pt to feel confident in self care in preparation for discharge.    Time 6   Period Weeks   Status On-going             Patient will benefit from skilled therapeutic intervention in order to improve the following deficits and impairments:     Visit Diagnosis: Pain in right lower leg  Ulcer of right pretibial region, with necrosis of muscle [facility])     Problem List Patient Active Problem List   Diagnosis Date Noted  . Cellulitis of leg, right 12/06/2016  . Ulcer of right leg ([place]) 12/06/2016  . HTN (hypertension) 12/06/2016  . Lower extremity edema 12/06/2016    Teena Irani, PTA/CLT [phone]  01/25/2017, 12:31 PM  [facility] [address] [place], [place], [address] Phone: [phone]   Fax:  [phone]  Name: Crystal Coleman MRN: [id number] Date of Birth: 1956-04-25

## 2017-02-01 ENCOUNTER — Ambulatory Visit (HOSPITAL_COMMUNITY): Payer: 59 | Admitting: Physical Therapy

## 2017-02-01 DIAGNOSIS — L97813 Non-pressure chronic ulcer of other part of right lower leg with necrosis of muscle: Secondary | ICD-10-CM

## 2017-02-01 DIAGNOSIS — M79661 Pain in right lower leg: Secondary | ICD-10-CM

## 2017-02-01 NOTE — Therapy (Signed)
[place], [place], [address] Phone: [phone]   Fax:  [phone]  Wound Care Therapy  Patient Details  Name: Crystal Coleman MRN: [id number] Date of Birth: Mar 30, 1956 Referring Provider: Domingo Mend  Encounter Date: 02/01/2017    Past Medical History:  Diagnosis Date  . Arthritis   . Hypertension     Past Surgical History:  Procedure Laterality Date  . CHOLECYSTECTOMY      There were no vitals filed for this visit.                  Wound Therapy - 02/01/17 0900    Subjective Pt states she would like today to be her last day.  States she feels she can take care of it at this point.    Patient and Family Stated Goals Wound to be healed    Date of Onset 10/17/16   Prior Treatments self care, Acute hospital stay.     Pain Assessment No/denies pain   Evaluation and Treatment Procedures Explained to Patient/Family Yes   Evaluation and Treatment Procedures agreed to   Wound Properties Date First Assessed: 12/12/16 Time First Assessed: 1021 Wound Type: Non-pressure wound Location: Leg Location Orientation: Right;Lower;Anterior Present on Admission: Yes   Dressing Type Impregnated gauze (bismuth)  silver hydrofiber, vaseline, 4x4, cotton, gauze, netting   Dressing Changed Changed   Dressing Status Old drainage   Dressing Change Frequency PRN   Site / Wound Assessment Yellow   % Wound base Red or Granulating 100%  following debridement initally was 0%   % Wound base Yellow/Fibrinous Exudate 0% initally was 100%   % Wound base Black/Eschar 0%   Peri-wound Assessment Intact   Wound Length (cm) 2.5 cm initally was 4.5    Wound Width (cm) 3 cm was 4.5   Wound Depth (cm) 0 cm was .3 cm           Margins Attached edges (approximated)   Drainage Amount Minimal   Drainage Description Serous   Treatment Cleansed;Debridement (Selective)   Selective Debridement - Location entire wound bed  with concentration on the edges   Selective Debridement - Tools Used Other (comment)  gauze   Selective Debridement - Tissue Removed slough    Wound Therapy - Clinical Statement wound remeasured with overall improvement and 100% granullated following gauze debridement.  Pt with overall reduced sensitivity.  Pt instructed with proper dressing change and care of wound and given remaining bandages/dressings to assist with wound closure.  Pt verbalized understanding and ability to take care of wound independently.    Factors Delaying/Impairing Wound Healing Infection - systemic/local   Hydrotherapy Plan Debridement;Dressing change;Patient/family education   Wound Therapy - Frequency --  2x/ week   Wound Therapy - Current Recommendations PT   Wound Therapy - Follow Up Recommendations --  LE cleansed and moisturized prior to dressing .   Wound Plan Discharge to self care of wound.  Will keep remaining appointment on schedule in case wound is not improving, otherwise discharge.    Dressing  cotton used to make a more cylindrical shape.  Dressing from MTP to knee using foam.   Both of these were done to assist in stopping dressing from slipping as this is a complaint from pt.  Pt dressed with cotton, silver hydrofiber, 4x4, gauze and netting size 5   Dressing xeroform, vaseline, 4x4, cotton, gauze,foam, gauze and  netting  PT Short Term Goals - 02/01/17 0904      PT SHORT TERM GOAL #1   Title Pt wound to be 50% granulated to decrease risk of infection    Time 3   Period Weeks   Status Achieved     PT SHORT TERM GOAL #2   Title Pt drainage to be decreased to minimum to decrease risk of infection    Time 3   Period Weeks   Status Achieved     PT SHORT TERM GOAL #3   Title Pt to wound depth to decrease to .1 cm    Time 3   Period Weeks   Status Achieved           PT Long Term Goals - 02/01/17 2505      PT LONG TERM GOAL #1   Title Pt wound to be 100%  granulated to reduce risk of infection    Time 6   Period Weeks   Status Achieved     PT LONG TERM GOAL #2   Title Pt wound size to be decreased to no greater than 2.5 cm squared to allow pt to feel confident in self care in preparation for discharge.    Time 6   Period Weeks   Status Partially Met  length is 2.5, width is 3 cm             Patient will benefit from skilled therapeutic intervention in order to improve the following deficits and impairments:     Visit Diagnosis: Pain in right lower leg  Ulcer of right pretibial region, with necrosis of muscle [facility])     Problem List Patient Active Problem List   Diagnosis Date Noted  . Cellulitis of leg, right 12/06/2016  . Ulcer of right leg ([place]) 12/06/2016  . HTN (hypertension) 12/06/2016  . Lower extremity edema 12/06/2016    Teena Irani, PTA/CLT [place], PT CLT [phone] 02/01/2017, 9:05 AM  [facility] [address] [place], [place], [address] Phone: [phone]   Fax:  [phone]  Name: Crystal Coleman MRN: [id number] Date of Birth: [birth year]  PHYSICAL THERAPY DISCHARGE SUMMARY  Visits from Start of Care: 14  Current functional level related to goals / functional outcomes: See above   Remaining deficits: See above   Education / Equipment: HEP Plan: Patient agrees to discharge.  Patient goals were partially met. Patient is being discharged due to being pleased with the current functional level.  ?????    Rayetta Humphrey, [place] CLT [phone]

## 2017-02-05 ENCOUNTER — Telehealth (HOSPITAL_COMMUNITY): Payer: Self-pay | Admitting: Internal Medicine

## 2017-02-05 NOTE — Telephone Encounter (Signed)
02/05/17 pt cx the last appt.  She said that when she was here last visit she was told that she was being released but to keep that last appt in case it was needed.  She said that when she cleaned the wound it looked much better than it did when she was last here.

## 2017-02-08 ENCOUNTER — Ambulatory Visit (HOSPITAL_COMMUNITY): Payer: 59

## 2018-02-21 ENCOUNTER — Other Ambulatory Visit (HOSPITAL_COMMUNITY): Payer: Self-pay | Admitting: Family Medicine

## 2018-02-21 DIAGNOSIS — Z1231 Encounter for screening mammogram for malignant neoplasm of breast: Secondary | ICD-10-CM

## 2018-03-11 ENCOUNTER — Encounter (HOSPITAL_COMMUNITY): Payer: Self-pay

## 2018-03-11 ENCOUNTER — Ambulatory Visit (HOSPITAL_COMMUNITY)
Admission: RE | Admit: 2018-03-11 | Discharge: 2018-03-11 | Disposition: A | Discharge status: Home / Self Care | Payer: BLUE CROSS/BLUE SHIELD | Source: Ambulatory Visit | Attending: Family Medicine | Admitting: Family Medicine

## 2018-03-11 DIAGNOSIS — Z1231 Encounter for screening mammogram for malignant neoplasm of breast: Secondary | ICD-10-CM | POA: Diagnosis present

## 2018-03-26 ENCOUNTER — Other Ambulatory Visit (HOSPITAL_COMMUNITY)
Admission: RE | Admit: 2018-03-26 | Discharge: 2018-03-26 | Disposition: A | Discharge status: Home / Self Care | Payer: BLUE CROSS/BLUE SHIELD | Source: Ambulatory Visit | Attending: Obstetrics & Gynecology | Admitting: Obstetrics & Gynecology

## 2018-03-26 ENCOUNTER — Ambulatory Visit: Payer: BLUE CROSS/BLUE SHIELD | Admitting: Women's Health

## 2018-03-26 ENCOUNTER — Encounter: Payer: Self-pay | Admitting: Women's Health

## 2018-03-26 VITALS — BP 150/80 | HR 95 | Ht 63.0 in | Wt 232.4 lb

## 2018-03-26 DIAGNOSIS — Z124 Encounter for screening for malignant neoplasm of cervix: Secondary | ICD-10-CM

## 2018-03-26 NOTE — Progress Notes (Signed)
   WELL-WOMAN EXAMINATION-Pap smear only Patient name: Crystal Coleman MRN [id number]  Date of birth: August 29, 1956 Chief Complaint:   Gynecologic Exam (pap only)  History of Present Illness:   Crystal Coleman is a 62 y.o. G2P3 Caucasian female being seen today for a Pap smear only, she recently had full physical including breast exam w/ her PCP. Current complaints: none  PCP: [facility]      No LMP recorded. Patient is postmenopausal. The current method of family planning is post menopausal status Last pap ~66yrs ago. Results were: normal, only abnormal was after last child born over 27yrs ago, all since have been normal Last mammogram: 03/11/18. Results were: normal Review of Systems:   Pertinent items are noted in HPI Denies any abnormal vaginal discharge/itching/odor/irritation, problems with intercourse unless otherwise stated above. Pertinent History Reviewed:  Reviewed past medical,surgical, social and family history.  Reviewed problem list, medications and allergies. Physical Assessment:   Vitals:   03/26/18 0946  BP: (!) 150/80  Pulse: 95  Weight: 232 lb 6.4 oz (105.4 kg)  Height: 5\' 3"  (1.6 m)  Body mass index is 41.17 kg/m.        Physical Examination:   General appearance - well appearing, and in no distress  Mental status - alert, oriented to person, place, and time  Psych:  She has a normal mood and affect  Skin - warm and dry, normal color, no suspicious lesions noted  Chest - effort normal  Heart - normal rate   Pelvic - VULVA: normal appearing vulva with no masses, tenderness or lesions  VAGINA: normal appearing vagina with normal color and discharge, no lesions  CERVIX: normal appearing cervix without discharge or lesions, no CMT  Thin prep pap is done w/ HR HPV cotesting  UTERUS: uterus is felt to be normal size, shape, consistency and nontender   ADNEXA: No adnexal masses or tenderness noted.  No results found for this or any previous visit (from the past  24 hour(s)).  Assessment & Plan:  1) Well-Woman Exam-Pap smear only, discussed can stop pap smears after 65yo, she will be 62yo in 3 years when next pap due as long as this one is normal- discussed she can let this be her last since she will be past 63yo at that time, or can do one more at 62yo and then be done, will leave this up to her.  Labs/procedures today: pap  No orders of the defined types were placed in this encounter.   Follow-up: Return in about 3 years (around 03/26/2021) for pap.  [place], [facility] 03/26/2018 10:35 AM

## 2018-03-26 NOTE — Addendum Note (Signed)
Addended by: Diona Fanti A on: 03/26/2018 10:40 AM   Modules accepted: Orders

## 2018-03-27 LAB — CYTOLOGY - PAP
Diagnosis: NEGATIVE
HPV (WINDOPATH): NOT DETECTED

## 2019-04-23 ENCOUNTER — Other Ambulatory Visit (HOSPITAL_COMMUNITY): Payer: Self-pay | Admitting: Family Medicine

## 2019-04-23 DIAGNOSIS — Z1231 Encounter for screening mammogram for malignant neoplasm of breast: Secondary | ICD-10-CM

## 2019-08-25 ENCOUNTER — Ambulatory Visit (HOSPITAL_COMMUNITY)
Admission: RE | Admit: 2019-08-25 | Discharge: 2019-08-25 | Disposition: A | Discharge status: Home / Self Care | Payer: Medicare HMO | Source: Ambulatory Visit | Attending: Family Medicine | Admitting: Family Medicine

## 2019-08-25 ENCOUNTER — Other Ambulatory Visit: Payer: Self-pay

## 2019-08-25 DIAGNOSIS — Z1231 Encounter for screening mammogram for malignant neoplasm of breast: Secondary | ICD-10-CM | POA: Insufficient documentation

## 2019-08-28 ENCOUNTER — Other Ambulatory Visit (HOSPITAL_COMMUNITY): Payer: Self-pay | Admitting: Family Medicine

## 2019-08-28 DIAGNOSIS — R928 Other abnormal and inconclusive findings on diagnostic imaging of breast: Secondary | ICD-10-CM

## 2019-09-02 ENCOUNTER — Ambulatory Visit (HOSPITAL_COMMUNITY): Admission: RE | Admit: 2019-09-02 | Payer: Medicare HMO | Source: Ambulatory Visit

## 2019-09-02 ENCOUNTER — Other Ambulatory Visit: Payer: Self-pay

## 2019-09-02 ENCOUNTER — Ambulatory Visit (HOSPITAL_COMMUNITY)
Admission: RE | Admit: 2019-09-02 | Discharge: 2019-09-02 | Disposition: A | Discharge status: Home / Self Care | Payer: Medicare HMO | Source: Ambulatory Visit | Attending: Family Medicine | Admitting: Family Medicine

## 2019-09-02 DIAGNOSIS — R928 Other abnormal and inconclusive findings on diagnostic imaging of breast: Secondary | ICD-10-CM | POA: Diagnosis not present

## 2019-09-16 ENCOUNTER — Other Ambulatory Visit (HOSPITAL_COMMUNITY): Payer: Medicare HMO

## 2019-09-16 ENCOUNTER — Encounter (HOSPITAL_COMMUNITY): Payer: Medicare HMO

## 2019-09-25 ENCOUNTER — Other Ambulatory Visit: Payer: Self-pay | Admitting: Internal Medicine

## 2019-09-25 ENCOUNTER — Other Ambulatory Visit: Payer: Self-pay | Admitting: Family Medicine

## 2019-09-25 DIAGNOSIS — R921 Mammographic calcification found on diagnostic imaging of breast: Secondary | ICD-10-CM

## 2019-10-01 ENCOUNTER — Other Ambulatory Visit: Payer: Self-pay | Admitting: Family Medicine

## 2019-10-01 DIAGNOSIS — R928 Other abnormal and inconclusive findings on diagnostic imaging of breast: Secondary | ICD-10-CM

## 2019-10-01 DIAGNOSIS — R921 Mammographic calcification found on diagnostic imaging of breast: Secondary | ICD-10-CM

## 2019-10-03 ENCOUNTER — Other Ambulatory Visit: Payer: Self-pay

## 2019-10-03 ENCOUNTER — Ambulatory Visit
Admission: RE | Admit: 2019-10-03 | Discharge: 2019-10-03 | Disposition: A | Discharge status: Home / Self Care | Payer: Medicare HMO | Source: Ambulatory Visit | Attending: Family Medicine | Admitting: Family Medicine

## 2019-10-03 DIAGNOSIS — R921 Mammographic calcification found on diagnostic imaging of breast: Secondary | ICD-10-CM

## 2019-10-03 DIAGNOSIS — R928 Other abnormal and inconclusive findings on diagnostic imaging of breast: Secondary | ICD-10-CM

## 2019-10-24 ENCOUNTER — Other Ambulatory Visit: Payer: Self-pay | Admitting: Surgery

## 2019-10-24 DIAGNOSIS — D241 Benign neoplasm of right breast: Secondary | ICD-10-CM

## 2019-11-25 ENCOUNTER — Other Ambulatory Visit: Payer: Self-pay

## 2019-11-25 ENCOUNTER — Encounter (HOSPITAL_BASED_OUTPATIENT_CLINIC_OR_DEPARTMENT_OTHER): Payer: Self-pay | Admitting: Surgery

## 2019-11-28 ENCOUNTER — Other Ambulatory Visit (HOSPITAL_COMMUNITY): Payer: Medicare HMO

## 2019-11-28 ENCOUNTER — Encounter (HOSPITAL_BASED_OUTPATIENT_CLINIC_OR_DEPARTMENT_OTHER)
Admission: RE | Admit: 2019-11-28 | Discharge: 2019-11-28 | Disposition: A | Discharge status: Home / Self Care | Payer: Medicare HMO | Source: Ambulatory Visit | Attending: Surgery | Admitting: Surgery

## 2019-11-28 ENCOUNTER — Other Ambulatory Visit: Payer: Self-pay

## 2019-11-28 ENCOUNTER — Other Ambulatory Visit (HOSPITAL_COMMUNITY)
Admission: RE | Admit: 2019-11-28 | Discharge: 2019-11-28 | Disposition: A | Discharge status: Home / Self Care | Payer: Medicare HMO | Source: Ambulatory Visit | Attending: Surgery | Admitting: Surgery

## 2019-11-28 DIAGNOSIS — D241 Benign neoplasm of right breast: Secondary | ICD-10-CM | POA: Insufficient documentation

## 2019-11-28 DIAGNOSIS — Z20822 Contact with and (suspected) exposure to covid-19: Secondary | ICD-10-CM | POA: Diagnosis not present

## 2019-11-28 DIAGNOSIS — Z01818 Encounter for other preprocedural examination: Secondary | ICD-10-CM | POA: Diagnosis present

## 2019-11-28 LAB — BASIC METABOLIC PANEL
Anion gap: 11 (ref 5–15)
BUN: 15 mg/dL (ref 8–23)
CO2: 25 mmol/L (ref 22–32)
Calcium: 9.5 mg/dL (ref 8.9–10.3)
Chloride: 106 mmol/L (ref 98–111)
Creatinine, Ser: 0.85 mg/dL (ref 0.44–1.00)
GFR calc Af Amer: >60 mL/min (ref >60)
GFR calc non Af Amer: >60 mL/min (ref >60)
Glucose, Bld: 89 mg/dL (ref 70–99)
Potassium: 4.8 mmol/L (ref 3.5–5.1)
Sodium: 142 mmol/L (ref 135–145)

## 2019-11-28 LAB — SARS CORONAVIRUS 2 (TAT 6-24 HRS): SARS Coronavirus 2: NEGATIVE

## 2019-11-28 MED ORDER — CHLORHEXIDINE GLUCONATE CLOTH 2 % EX PADS: 6.0000 | MEDICATED_PAD | Freq: Once | CUTANEOUS | Status: DC

## 2019-11-28 NOTE — Progress Notes (Signed)
Reviewed EKG with Dr. Ola Spurr. Okay to proceed with surgery as scheduled.

## 2019-12-01 NOTE — H&P (Signed)
Crystal Coleman  Location: [facility] Surgery Patient #: [id number] DOB: 05-26-1956 Married / Language: English / Race: White Female   History of Present Illness  The patient is a 64 year old female who presents with a complaint of Breast problems. This patient is referred by Dr. Jani Gravel for evaluation of an abnormal mammogram of the right breast. She was found have a 10 mm area of calcifications in the upper outer right breast measuring approximately 10 mm. She underwent a stereotactic biopsy showing an intraductal papilloma, calcifications, metaplasia, and fibrocystic changes. Complete surgical excision of this area has been recommended. She has no previous history of breast biopsies or breast cancer. There is no family history of breast cancer. She denies nipple discharge. She is otherwise healthy and without complaints.   Past Surgical History  Appendectomy  Breast Biopsy  Right. Gallbladder Surgery - Open   Diagnostic Studies History Colonoscopy  never Mammogram  within last year Pap Smear  1-5 years ago  Allergies  Penicillins   Medication History Quinapril HCl (40MG  Tablet, Oral) Active. hydrALAZINE HCl (50MG  Tablet, Oral) Active. Furosemide (20MG  Tablet, Oral) Active. Bayer Aspirin EC Low Dose (81MG  Tablet DR, Oral) Active. Carvedilol (6.25MG  Tablet, Oral) Active. Centrum Silver 50+Women (Oral) Active. Vitamin D3 (50 MCG(2000 UT) Tablet, Oral) Active. Medications Reconciled  Social History  Caffeine use  Carbonated beverages. No alcohol use  No drug use  Tobacco use  Never smoker.  Family History  Hypertension  Father, Mother.  Pregnancy / Birth History   Age at menarche  68 years. Age of menopause  49-55 Contraceptive History  Oral contraceptives. Gravida  2 Maternal age  57-20 Para  3  Other Problems  Arthritis  Cholelithiasis  High blood pressure  Pulmonary Embolism / Blood Clot in Legs     Review of  Systems ( petite Loss, Chills, Fatigue, Fever, Night Sweats, Weight Gain and Weight Loss. Skin Not Present- Change in Wart/Mole, Dryness, Hives, Jaundice, New Lesions, Non-Healing Wounds, Rash and Ulcer. HEENT Present- Wears glasses/contact lenses. Not Present- Earache, Hearing Loss, Hoarseness, Nose Bleed, Oral Ulcers, Ringing in the Ears, Seasonal Allergies, Sinus Pain, Sore Throat, Visual Disturbances and Yellow Eyes. Respiratory Not Present- Bloody sputum, Chronic Cough, Difficulty Breathing, Snoring and Wheezing. Breast Present- Breast Mass. Not Present- Breast Pain, Nipple Discharge and Skin Changes. Cardiovascular Not Present- Chest Pain, Difficulty Breathing Lying Down, Leg Cramps, Palpitations, Rapid Heart Rate, Shortness of Breath and Swelling of Extremities. Gastrointestinal Not Present- Abdominal Pain, Bloating, Bloody Stool, Change in Bowel Habits, Chronic diarrhea, Constipation, Difficulty Swallowing, Excessive gas, Gets full quickly at meals, Hemorrhoids, Indigestion, Nausea, Rectal Pain and Vomiting. Female Genitourinary Not Present- Frequency, Nocturia, Painful Urination, Pelvic Pain and Urgency. Musculoskeletal Not Present- Back Pain, Joint Pain, Joint Stiffness, Muscle Pain, Muscle Weakness and Swelling of Extremities. Neurological Not Present- Decreased Memory, Fainting, Headaches, Numbness, Seizures, Tingling, Tremor, Trouble walking and Weakness. Psychiatric Not Present- Anxiety, Bipolar, Change in Sleep Pattern, Depression, Fearful and Frequent crying. Endocrine Not Present- Cold Intolerance, Excessive Hunger, Hair Changes, Heat Intolerance, Hot flashes and New Diabetes. Hematology Present- Blood Thinners. Not Present- Easy Bruising, Excessive bleeding, Gland problems, HIV and Persistent Infections.  Vitals  Weight: 233.13 lb Height: 63in Body Surface Area: 2.06 m Body Mass Index: 41.3 kg/m  Temp.: 97.77F  Pulse: 85 (Regular)  P.OX: 98% (Room air) BP: 176/100  (Sitting, Left Arm, Standard)     Physical Exam  General Mental Status-Alert. General Appearance-Consistent with stated age. Hydration-Well hydrated. Voice-Normal.  Head and  Neck Head-normocephalic, atraumatic with no lesions or palpable masses. Trachea-midline. Thyroid Gland Characteristics - normal size and consistency.  Eye Eyeball - Bilateral-Extraocular movements intact. Sclera/Conjunctiva - Bilateral-No scleral icterus.  Chest and Lung Exam Chest and lung exam reveals -quiet, even and easy respiratory effort with no use of accessory muscles and on auscultation, normal breath sounds, no adventitious sounds and normal vocal resonance. Inspection Chest Wall - Normal. Back - normal.  Breast Breast - Left-Symmetric, Non Tender, No Biopsy scars, no Dimpling - Left, No Inflammation, No Lumpectomy scars, No Mastectomy scars, No Peau d' Orange. Breast - Right-Symmetric, Non Tender, No Biopsy scars, no Dimpling - Right, No Inflammation, No Lumpectomy scars, No Mastectomy scars, No Peau d' Orange. Breast Lump-No Palpable Breast Mass. Note: There are no palpable masses in either breast.   Cardiovascular Cardiovascular examination reveals -normal heart sounds, regular rate and rhythm with no murmurs and normal pedal pulses bilaterally.  Abdomen - Did not examine.  Neurologic - Did not examine.  Musculoskeletal - Did not examine.  Lymphatic Head & Neck  General Head & Neck Lymphatics: Bilateral - Description - Normal. Axillary  General Axillary Region: Bilateral - Description - Normal. Tenderness - Non Tender. Femoral & Inguinal - Did not examine.    Assessment & Plan  INTRADUCTAL PAPILLOMA OF BREAST, RIGHT (D24.1)  Impression: This is a patient with an intraductal papilloma of the right breast as well as metaplasia, fibrocystic changes, and calcifications. A right breast radioactive seed guided lumpectomy is recommended. I have reviewed  her x-ray data as well as her pathology results. I discussed this with her in detail and gave her a copy of the path. We discussed the reasons for surgery. It is recommended this area be excised for complete histologic evaluation to rule out a breast cancer. I discussed the surgical procedure in detail. I discussed the risk of surgery which includes but is not limited to bleeding, infection, injury to any structures, the need for further surgery of malignancy is found, cardiopulmonary issues, postoperative recovery, etc. She understands and wished to proceed with surgery.

## 2019-12-02 ENCOUNTER — Ambulatory Visit (HOSPITAL_BASED_OUTPATIENT_CLINIC_OR_DEPARTMENT_OTHER): Payer: Medicare HMO | Admitting: Certified Registered Nurse Anesthetist

## 2019-12-02 ENCOUNTER — Ambulatory Visit (HOSPITAL_BASED_OUTPATIENT_CLINIC_OR_DEPARTMENT_OTHER)
Admission: RE | Admit: 2019-12-02 | Discharge: 2019-12-02 | Disposition: A | Discharge status: Home / Self Care | Payer: Medicare HMO | Attending: Surgery | Admitting: Surgery

## 2019-12-02 ENCOUNTER — Encounter (HOSPITAL_BASED_OUTPATIENT_CLINIC_OR_DEPARTMENT_OTHER): Payer: Self-pay | Admitting: Surgery

## 2019-12-02 ENCOUNTER — Encounter (HOSPITAL_BASED_OUTPATIENT_CLINIC_OR_DEPARTMENT_OTHER)
Admission: RE | Disposition: A | Discharge status: Home / Self Care | Payer: Self-pay | Source: Home / Self Care | Attending: Surgery

## 2019-12-02 ENCOUNTER — Other Ambulatory Visit: Payer: Self-pay

## 2019-12-02 DIAGNOSIS — Z79899 Other long term (current) drug therapy: Secondary | ICD-10-CM | POA: Insufficient documentation

## 2019-12-02 DIAGNOSIS — M1990 Unspecified osteoarthritis, unspecified site: Secondary | ICD-10-CM | POA: Diagnosis not present

## 2019-12-02 DIAGNOSIS — I10 Essential (primary) hypertension: Secondary | ICD-10-CM | POA: Insufficient documentation

## 2019-12-02 DIAGNOSIS — D241 Benign neoplasm of right breast: Secondary | ICD-10-CM | POA: Diagnosis present

## 2019-12-02 DIAGNOSIS — Z8249 Family history of ischemic heart disease and other diseases of the circulatory system: Secondary | ICD-10-CM | POA: Diagnosis not present

## 2019-12-02 DIAGNOSIS — Z880 Allergy status to penicillin: Secondary | ICD-10-CM | POA: Insufficient documentation

## 2019-12-02 DIAGNOSIS — Z9049 Acquired absence of other specified parts of digestive tract: Secondary | ICD-10-CM | POA: Diagnosis not present

## 2019-12-02 DIAGNOSIS — Z6841 Body Mass Index (BMI) 40.0 and over, adult: Secondary | ICD-10-CM | POA: Diagnosis not present

## 2019-12-02 HISTORY — DX: Other complications of anesthesia, initial encounter: T88.59XA

## 2019-12-02 HISTORY — DX: Localized edema: R60.0

## 2019-12-02 HISTORY — DX: Edema, unspecified: R60.9

## 2019-12-02 HISTORY — PX: BREAST LUMPECTOMY WITH RADIOACTIVE SEED LOCALIZATION: SHX6424

## 2019-12-02 HISTORY — DX: Other specified postprocedural states: Z98.890

## 2019-12-02 HISTORY — DX: Nausea with vomiting, unspecified: R11.2

## 2019-12-02 SURGERY — BREAST LUMPECTOMY WITH RADIOACTIVE SEED LOCALIZATION
Anesthesia: General | Site: Breast | Laterality: Right

## 2019-12-02 MED ORDER — FENTANYL CITRATE (PF) 100 MCG/2ML IJ SOLN: 25.0000 ug | INTRAMUSCULAR | Status: DC | PRN

## 2019-12-02 MED ORDER — OXYCODONE HCL 5 MG PO TABS: 5.0000 mg | ORAL_TABLET | Freq: Once | ORAL | Status: DC | PRN

## 2019-12-02 MED ORDER — PHENYLEPHRINE 40 MCG/ML (10ML) SYRINGE FOR IV PUSH (FOR BLOOD PRESSURE SUPPORT)
PREFILLED_SYRINGE | INTRAVENOUS | Status: DC | PRN
  Administered 2019-12-02 (×4): 80 ug via INTRAVENOUS

## 2019-12-02 MED ORDER — EPHEDRINE SULFATE-NACL 50-0.9 MG/10ML-% IV SOSY
PREFILLED_SYRINGE | INTRAVENOUS | Status: DC | PRN
  Administered 2019-12-02: 10 mg via INTRAVENOUS

## 2019-12-02 MED ORDER — GABAPENTIN 300 MG PO CAPS
300.0000 mg | ORAL_CAPSULE | ORAL | Status: AC
  Administered 2019-12-02: 300 mg via ORAL

## 2019-12-02 MED ORDER — CIPROFLOXACIN IN D5W 400 MG/200ML IV SOLN
INTRAVENOUS | Status: AC
  Filled 2019-12-02: qty 200

## 2019-12-02 MED ORDER — MIDAZOLAM HCL 2 MG/2ML IJ SOLN: 1.0000 mg | INTRAMUSCULAR | Status: DC | PRN

## 2019-12-02 MED ORDER — TRAMADOL HCL 50 MG PO TABS: 50.0000 mg | ORAL_TABLET | Freq: Four times a day (QID) | ORAL | 0 refills | Status: DC | PRN

## 2019-12-02 MED ORDER — ONDANSETRON HCL 4 MG/2ML IJ SOLN
INTRAMUSCULAR | Status: DC | PRN
  Administered 2019-12-02: 4 mg via INTRAVENOUS

## 2019-12-02 MED ORDER — DEXAMETHASONE SODIUM PHOSPHATE 10 MG/ML IJ SOLN
INTRAMUSCULAR | Status: DC | PRN
  Administered 2019-12-02: 4 mg via INTRAVENOUS

## 2019-12-02 MED ORDER — BUPIVACAINE HCL (PF) 0.5 % IJ SOLN
INTRAMUSCULAR | Status: DC | PRN
  Administered 2019-12-02: 10 mL

## 2019-12-02 MED ORDER — ONDANSETRON HCL 4 MG/2ML IJ SOLN
INTRAMUSCULAR | Status: AC
  Filled 2019-12-02: qty 2

## 2019-12-02 MED ORDER — PHENYLEPHRINE 40 MCG/ML (10ML) SYRINGE FOR IV PUSH (FOR BLOOD PRESSURE SUPPORT)
PREFILLED_SYRINGE | INTRAVENOUS | Status: AC
  Filled 2019-12-02: qty 10

## 2019-12-02 MED ORDER — LIDOCAINE 2% (20 MG/ML) 5 ML SYRINGE
INTRAMUSCULAR | Status: AC
  Filled 2019-12-02: qty 5

## 2019-12-02 MED ORDER — MIDAZOLAM HCL 2 MG/2ML IJ SOLN
INTRAMUSCULAR | Status: AC
  Filled 2019-12-02: qty 2

## 2019-12-02 MED ORDER — FENTANYL CITRATE (PF) 100 MCG/2ML IJ SOLN
INTRAMUSCULAR | Status: DC | PRN
  Administered 2019-12-02 (×2): 50 ug via INTRAVENOUS

## 2019-12-02 MED ORDER — MIDAZOLAM HCL 5 MG/5ML IJ SOLN
INTRAMUSCULAR | Status: DC | PRN
  Administered 2019-12-02: 2 mg via INTRAVENOUS

## 2019-12-02 MED ORDER — EPHEDRINE 5 MG/ML INJ
INTRAVENOUS | Status: AC
  Filled 2019-12-02: qty 10

## 2019-12-02 MED ORDER — LIDOCAINE 2% (20 MG/ML) 5 ML SYRINGE
INTRAMUSCULAR | Status: DC | PRN
  Administered 2019-12-02: 60 mg via INTRAVENOUS
  Administered 2019-12-02: 40 mg via INTRAVENOUS

## 2019-12-02 MED ORDER — FENTANYL CITRATE (PF) 100 MCG/2ML IJ SOLN: 50.0000 ug | INTRAMUSCULAR | Status: DC | PRN

## 2019-12-02 MED ORDER — ACETAMINOPHEN 500 MG PO TABS
ORAL_TABLET | ORAL | Status: AC
  Filled 2019-12-02: qty 2

## 2019-12-02 MED ORDER — GABAPENTIN 300 MG PO CAPS
ORAL_CAPSULE | ORAL | Status: AC
  Filled 2019-12-02: qty 1

## 2019-12-02 MED ORDER — LABETALOL HCL 5 MG/ML IV SOLN
10.0000 mg | Freq: Once | INTRAVENOUS | Status: AC
  Administered 2019-12-02: 10 mg via INTRAVENOUS

## 2019-12-02 MED ORDER — OXYCODONE HCL 5 MG/5ML PO SOLN: 5.0000 mg | Freq: Once | ORAL | Status: DC | PRN

## 2019-12-02 MED ORDER — PROPOFOL 10 MG/ML IV BOLUS
INTRAVENOUS | Status: DC | PRN
  Administered 2019-12-02: 200 mg via INTRAVENOUS

## 2019-12-02 MED ORDER — CIPROFLOXACIN IN D5W 400 MG/200ML IV SOLN
400.0000 mg | INTRAVENOUS | Status: AC
  Administered 2019-12-02: 400 mg via INTRAVENOUS

## 2019-12-02 MED ORDER — FENTANYL CITRATE (PF) 100 MCG/2ML IJ SOLN
INTRAMUSCULAR | Status: AC
  Filled 2019-12-02: qty 2

## 2019-12-02 MED ORDER — LABETALOL HCL 5 MG/ML IV SOLN
INTRAVENOUS | Status: AC
  Filled 2019-12-02: qty 4

## 2019-12-02 MED ORDER — LACTATED RINGERS IV SOLN: INTRAVENOUS | Status: DC

## 2019-12-02 MED ORDER — ACETAMINOPHEN 500 MG PO TABS
1000.0000 mg | ORAL_TABLET | ORAL | Status: AC
  Administered 2019-12-02: 12:00:00 1000 mg via ORAL

## 2019-12-02 MED ORDER — PROMETHAZINE HCL 25 MG/ML IJ SOLN: 6.2500 mg | INTRAMUSCULAR | Status: DC | PRN

## 2019-12-02 MED ORDER — DEXAMETHASONE SODIUM PHOSPHATE 10 MG/ML IJ SOLN
INTRAMUSCULAR | Status: AC
  Filled 2019-12-02: qty 1

## 2019-12-02 SURGICAL SUPPLY — 54 items
ADH SKN CLS APL DERMABOND .7 (GAUZE/BANDAGES/DRESSINGS) ×1
APL PRP STRL LF DISP 70% ISPRP (MISCELLANEOUS) ×1
APPLIER CLIP 9.375 MED OPEN (MISCELLANEOUS)
APR CLP MED 9.3 20 MLT OPN (MISCELLANEOUS)
BINDER BREAST 3XL (GAUZE/BANDAGES/DRESSINGS) IMPLANT
BINDER BREAST LRG (GAUZE/BANDAGES/DRESSINGS) IMPLANT
BINDER BREAST MEDIUM (GAUZE/BANDAGES/DRESSINGS) IMPLANT
BINDER BREAST XLRG (GAUZE/BANDAGES/DRESSINGS) IMPLANT
BINDER BREAST XXLRG (GAUZE/BANDAGES/DRESSINGS) IMPLANT
BLADE HEX COATED 2.75 (ELECTRODE) ×2 IMPLANT
BLADE SURG 15 STRL LF DISP TIS (BLADE) ×1 IMPLANT
BLADE SURG 15 STRL SS (BLADE) ×2
CANISTER SUC SOCK COL 7IN (MISCELLANEOUS) IMPLANT
CANISTER SUCT 1200ML W/VALVE (MISCELLANEOUS) IMPLANT
CHLORAPREP W/TINT 26 (MISCELLANEOUS) ×2 IMPLANT
CLIP APPLIE 9.375 MED OPEN (MISCELLANEOUS) IMPLANT
COVER BACK TABLE 60X90IN (DRAPES) ×2 IMPLANT
COVER MAYO STAND STRL (DRAPES) ×2 IMPLANT
COVER PROBE W GEL 5X96 (DRAPES) ×2 IMPLANT
COVER WAND RF STERILE (DRAPES) IMPLANT
DECANTER SPIKE VIAL GLASS SM (MISCELLANEOUS) IMPLANT
DERMABOND ADVANCED (GAUZE/BANDAGES/DRESSINGS) ×1
DERMABOND ADVANCED .7 DNX12 (GAUZE/BANDAGES/DRESSINGS) ×1 IMPLANT
DRAPE LAPAROSCOPIC ABDOMINAL (DRAPES) ×2 IMPLANT
DRAPE UTILITY XL STRL (DRAPES) ×2 IMPLANT
ELECT REM PT RETURN 9FT ADLT (ELECTROSURGICAL) ×2
ELECTRODE REM PT RTRN 9FT ADLT (ELECTROSURGICAL) ×1 IMPLANT
GAUZE SPONGE 4X4 12PLY STRL LF (GAUZE/BANDAGES/DRESSINGS) IMPLANT
GLOVE BIO SURGEON STRL SZ7 (GLOVE) ×1 IMPLANT
GLOVE BIOGEL PI IND STRL 7.5 (GLOVE) IMPLANT
GLOVE BIOGEL PI INDICATOR 7.5 (GLOVE) ×1
GLOVE EXAM NITRILE MD LF STRL (GLOVE) ×1 IMPLANT
GLOVE SURG SIGNA 7.5 PF LTX (GLOVE) ×2 IMPLANT
GOWN STRL REUS W/ TWL LRG LVL3 (GOWN DISPOSABLE) ×1 IMPLANT
GOWN STRL REUS W/ TWL XL LVL3 (GOWN DISPOSABLE) ×1 IMPLANT
GOWN STRL REUS W/TWL LRG LVL3 (GOWN DISPOSABLE)
GOWN STRL REUS W/TWL XL LVL3 (GOWN DISPOSABLE) ×4
KIT MARKER MARGIN INK (KITS) ×2 IMPLANT
NDL HYPO 25X1 1.5 SAFETY (NEEDLE) ×1 IMPLANT
NEEDLE HYPO 25X1 1.5 SAFETY (NEEDLE) ×2 IMPLANT
NS IRRIG 1000ML POUR BTL (IV SOLUTION) ×1 IMPLANT
PACK BASIN DAY SURGERY FS (CUSTOM PROCEDURE TRAY) ×2 IMPLANT
PENCIL SMOKE EVACUATOR (MISCELLANEOUS) ×2 IMPLANT
SLEEVE SCD COMPRESS KNEE MED (MISCELLANEOUS) ×2 IMPLANT
SPONGE LAP 4X18 RFD (DISPOSABLE) ×2 IMPLANT
SUT MNCRL AB 4-0 PS2 18 (SUTURE) ×2 IMPLANT
SUT SILK 2 0 SH (SUTURE) IMPLANT
SUT VIC AB 3-0 SH 27 (SUTURE) ×2
SUT VIC AB 3-0 SH 27X BRD (SUTURE) ×1 IMPLANT
SYR CONTROL 10ML LL (SYRINGE) ×2 IMPLANT
TOWEL GREEN STERILE FF (TOWEL DISPOSABLE) ×2 IMPLANT
TRAY FAXITRON CT DISP (TRAY / TRAY PROCEDURE) ×2 IMPLANT
TUBE CONNECTING 20X1/4 (TUBING) IMPLANT
YANKAUER SUCT BULB TIP NO VENT (SUCTIONS) IMPLANT

## 2019-12-02 NOTE — Anesthesia Postprocedure Evaluation (Signed)
Anesthesia Post Note  Patient: Crystal Coleman  Procedure(s) Performed: RIGHT BREAST LUMPECTOMY WITH RADIOACTIVE SEED LOCALIZATION (Right Breast)     Patient location during evaluation: PACU Anesthesia Type: General Level of consciousness: awake and alert and oriented Pain management: pain level controlled Vital Signs Assessment: post-procedure vital signs reviewed and stable Respiratory status: spontaneous breathing, nonlabored ventilation and respiratory function stable Cardiovascular status: blood pressure returned to baseline Postop Assessment: no apparent nausea or vomiting Anesthetic complications: no    Last Vitals:  Vitals:   12/02/19 1325 12/02/19 1330  BP: 130/69   Pulse: (!) 55 (!) 51  Resp: 19 18  Temp:    SpO2: 100% 97%    Last Pain:  Vitals:   12/02/19 1340  TempSrc:   PainSc: 0-No pain                 Brennan Bailey

## 2019-12-02 NOTE — Op Note (Signed)
RIGHT BREAST LUMPECTOMY WITH RADIOACTIVE SEED LOCALIZATION  Procedure Note  Crystal Coleman 12/02/2019   Pre-op Diagnosis: right breast intraductal papilloma     Post-op Diagnosis: same  Procedure(s): RIGHT BREAST LUMPECTOMY WITH RADIOACTIVE SEED LOCALIZATION  Surgeon(s): Coralie Keens, MD  Anesthesia: General  Staff:  Circulator: Ted Mcalpine, RN Scrub Person: Gregery Na, CST  Estimated Blood Loss: Minimal               Specimens: sent to path  Indications: This is a 64 year old female found to have calcifications in the upper outer quadrant of the right breast on screening mammography.  Stereotactic biopsy showed a papilloma.  The decision was made to proceed with a radioactive seed guided right breast lumpectomy  Procedure: The patient was brought to the operating room and identifies correct patient.  She is placed upon the operating table and general anesthesia was induced.  Her right breast was then prepped and draped in usual sterile fashion.  With the aid of the neoprobe, I identified the radioactive seed in the upper outer quadrant of the breast on the right side.  I anesthetized the skin in the area with Marcaine.  I then made a longitudinal incision with a scalpel.  I then dissected down into the breast tissue with electrocautery with the aid of the neoprobe.  I dissected around the area of increased reactive uptake with the neoprobe and took more superior medial tissue as recommended by radiology.  Once the effect in the specimen was removed, I marked all margins with marker paint.  An x-ray was performed on the specimen confirming that the radioactive seed was in the specimen.  It was then sent to pathology for evaluation.  Hemostasis was achieved with the cautery.  I anesthetized the incision further with Marcaine.  I then closed the subcutaneous tissue with interrupted 3-0 Vicryl sutures and closed the skin with a running 4-0 Monocryl.  Dermabond was  then applied.  The patient tolerated the procedure well.  All the counts were correct at the end of the procedure.  The patient was then extubated in the operating room and taken in a stable condition to the recovery room.          Coralie Keens   Date: 12/02/2019  Time: 12:59 PM

## 2019-12-02 NOTE — Anesthesia Procedure Notes (Signed)
Procedure Name: LMA Insertion Date/Time: 12/02/2019 12:33 PM Performed by: Genelle Bal, CRNA Pre-anesthesia Checklist: Patient identified, Emergency Drugs available, Suction available and Patient being monitored Patient Re-evaluated:Patient Re-evaluated prior to induction Oxygen Delivery Method: Circle system utilized Preoxygenation: Pre-oxygenation with 100% oxygen Induction Type: IV induction Ventilation: Mask ventilation without difficulty LMA: LMA inserted LMA Size: 4.0 Number of attempts: 1 Airway Equipment and Method: Bite block Placement Confirmation: positive ETCO2 Tube secured with: Tape Dental Injury: Teeth and Oropharynx as per pre-operative assessment

## 2019-12-02 NOTE — Discharge Instructions (Signed)
[place] Office Phone Number [phone]  BREAST BIOPSY/ PARTIAL MASTECTOMY: POST OP INSTRUCTIONS  Always review your discharge instruction sheet given to you by the facility where your surgery was performed.  IF YOU HAVE DISABILITY OR FAMILY LEAVE FORMS, YOU MUST BRING THEM TO THE OFFICE FOR PROCESSING.  DO NOT GIVE THEM TO YOUR DOCTOR.  1. A prescription for pain medication may be given to you upon discharge.  Take your pain medication as prescribed, if needed.  If narcotic pain medicine is not needed, then you may take acetaminophen (Tylenol) or ibuprofen (Advil) as needed. 2. Take your usually prescribed medications unless otherwise directed 3. If you need a refill on your pain medication, please contact your pharmacy.  They will contact our office to request authorization.  Prescriptions will not be filled after 5pm or on week-ends. 4. You should eat very light the first 24 hours after surgery, such as soup, crackers, pudding, etc.  Resume your normal diet the day after surgery. 5. Most patients will experience some swelling and bruising in the breast.  Ice packs and a good support bra will help.  Swelling and bruising can take several days to resolve.  6. It is common to experience some constipation if taking pain medication after surgery.  Increasing fluid intake and taking a stool softener will usually help or prevent this problem from occurring.  A mild laxative (Milk of Magnesia or Miralax) should be taken according to package directions if there are no bowel movements after 48 hours. 7. Unless discharge instructions indicate otherwise, you may remove your bandages 24-48 hours after surgery, and you may shower at that time.  You may have steri-strips (small skin tapes) in place directly over the incision.  These strips should be left on the skin for 7-10 days.  If your surgeon used skin glue on the incision, you may shower in 24 hours.  The glue will flake off over the  next 2-3 weeks.  Any sutures or staples will be removed at the office during your follow-up visit. 8. ACTIVITIES:  You may resume regular daily activities (gradually increasing) beginning the next day.  Wearing a good support bra or sports bra minimizes pain and swelling.  You may have sexual intercourse when it is comfortable. a. You may drive when you no longer are taking prescription pain medication, you can comfortably wear a seatbelt, and you can safely maneuver your car and apply brakes. b. RETURN TO WORK:  ______________________________________________________________________________________ 9. You should see your doctor in the office for a follow-up appointment approximately two weeks after your surgery.  Your doctor's nurse will typically make your follow-up appointment when she calls you with your pathology report.  Expect your pathology report 2-3 business days after your surgery.  You may call to check if you do not hear from [place] after three days. 10. OTHER INSTRUCTIONS:OK TO SHOWER STARTING TOMORROW 11. ICE PACK, TYLENOL, AND IBUPROFEN ALSO FOR PAIN 12. NO VIGOROUS ACTIVITY FOR ONE WEEK 13. _______________________________________________________________________________________________ _____________________________________________________________________________________________________________________________________ _____________________________________________________________________________________________________________________________________ _____________________________________________________________________________________________________________________________________  WHEN TO CALL YOUR DOCTOR: 1. Fever over 101.0 2. Nausea and/or vomiting. 3. Extreme swelling or bruising. 4. Continued bleeding from incision. 5. Increased pain, redness, or drainage from the incision.  The clinic staff is available to answer your questions during regular business hours.  Please don't hesitate  to call and ask to speak to one of the nurses for clinical concerns.  If you have a medical emergency, go to the nearest emergency room or call 911.  A surgeon from [facility] is always on call at the hospital.  For further questions, please visit [email or web address]        NO TYLENOL PRODUCTS UNTIL  5:43pm    NO ADVIL / MOTRIN UNTIL 5:43 pm if needed     Post Anesthesia Home Care Instructions  Activity: Get plenty of rest for the remainder of the day. A responsible individual must stay with you for 24 hours following the procedure.  For the next 24 hours, DO NOT: -Drive a car -Paediatric nurse -Drink alcoholic beverages -Take any medication unless instructed by your physician -Make any legal decisions or sign important papers.  Meals: Start with liquid foods such as gelatin or soup. Progress to regular foods as tolerated. Avoid greasy, spicy, heavy foods. If nausea and/or vomiting occur, drink only clear liquids until the nausea and/or vomiting subsides. Call your physician if vomiting continues.  Special Instructions/Symptoms: Your throat may feel dry or sore from the anesthesia or the breathing tube placed in your throat during surgery. If this causes discomfort, gargle with warm salt water. The discomfort should disappear within 24 hours.  If you had a scopolamine patch placed behind your ear for the management of post- operative nausea and/or vomiting:  1. The medication in the patch is effective for 72 hours, after which it should be removed.  Wrap patch in a tissue and discard in the trash. Wash hands thoroughly with soap and water. 2. You may remove the patch earlier than 72 hours if you experience unpleasant side effects which may include dry mouth, dizziness or visual disturbances. 3. Avoid touching the patch. Wash your hands with soap and water after contact with the patch.

## 2019-12-02 NOTE — Transfer of Care (Signed)
Immediate Anesthesia Transfer of Care Note  Patient: Crystal Coleman  Procedure(s) Performed: RIGHT BREAST LUMPECTOMY WITH RADIOACTIVE SEED LOCALIZATION (Right Breast)  Patient Location: PACU  Anesthesia Type:General  Level of Consciousness: drowsy and patient cooperative  Airway & Oxygen Therapy: Patient Spontanous Breathing and Patient connected to face mask oxygen  Post-op Assessment: Report given to RN and Post -op Vital signs reviewed and stable  Post vital signs: Reviewed and stable  Last Vitals:  Vitals Value Taken Time  BP 91/50 12/02/19 1304  Temp    Pulse 54 12/02/19 1307  Resp 16 12/02/19 1307  SpO2 97 % 12/02/19 1307  Vitals shown include unvalidated device data.  Last Pain:  Vitals:   12/02/19 1135  TempSrc: Oral  PainSc: 0-No pain      Patients Stated Pain Goal: 3 ([id number])  Complications: No apparent anesthesia complications

## 2019-12-02 NOTE — Interval H&P Note (Signed)
History and Physical Interval Note: no change in H and P  12/02/2019 11:22 AM  Crystal Coleman  has presented today for surgery, with the diagnosis of right breast intraductal papilloma.  The various methods of treatment have been discussed with the patient and family. After consideration of risks, benefits and other options for treatment, the patient has consented to  Procedure(s): RIGHT BREAST LUMPECTOMY WITH RADIOACTIVE SEED LOCALIZATION (Right) as a surgical intervention.  The patient's history has been reviewed, patient examined, no change in status, stable for surgery.  I have reviewed the patient's chart and labs.  Questions were answered to the patient's satisfaction.     Coralie Keens

## 2019-12-02 NOTE — Anesthesia Preprocedure Evaluation (Addendum)
Anesthesia Evaluation  Patient identified by MRN, date of birth, ID band Patient awake    Reviewed: Allergy & Precautions, NPO status , Patient's Chart, lab work & pertinent test results, reviewed documented beta blocker date and time   History of Anesthesia Complications (+) PONV and history of anesthetic complications  Airway Mallampati: II  TM Distance: >3 FB Neck ROM: Full    Dental  (+) Missing,    Pulmonary neg pulmonary ROS,    Pulmonary exam normal        Cardiovascular hypertension, Pt. on medications and Pt. on home beta blockers Normal cardiovascular exam     Neuro/Psych negative neurological ROS  negative psych ROS   GI/Hepatic negative GI ROS, Neg liver ROS,   Endo/Other  Morbid obesity  Renal/GU negative Renal ROS  negative genitourinary   Musculoskeletal  (+) Arthritis ,   Abdominal   Peds  Hematology negative hematology ROS (+)   Anesthesia Other Findings Right breast ca  Reproductive/Obstetrics negative OB ROS                           Anesthesia Physical Anesthesia Plan  ASA: III  Anesthesia Plan: General   Post-op Pain Management:    Induction: Intravenous  PONV Risk Score and Plan: 4 or greater and Treatment may vary due to age or medical condition, Ondansetron, Dexamethasone and Midazolam  Airway Management Planned: LMA  Additional Equipment: None  Intra-op Plan:   Post-operative Plan: Extubation in OR  Informed Consent: I have reviewed the patients History and Physical, chart, labs and discussed the procedure including the risks, benefits and alternatives for the proposed anesthesia with the patient or authorized representative who has indicated his/her understanding and acceptance.     Dental advisory given  Plan Discussed with: CRNA  Anesthesia Plan Comments:       Anesthesia Quick Evaluation

## 2019-12-03 ENCOUNTER — Encounter: Payer: Self-pay | Admitting: *Deleted

## 2019-12-04 LAB — SURGICAL PATHOLOGY

## 2020-12-20 ENCOUNTER — Other Ambulatory Visit (HOSPITAL_COMMUNITY): Payer: Self-pay | Admitting: Family Medicine

## 2020-12-20 DIAGNOSIS — Z1231 Encounter for screening mammogram for malignant neoplasm of breast: Secondary | ICD-10-CM

## 2020-12-23 ENCOUNTER — Ambulatory Visit (HOSPITAL_COMMUNITY)
Admission: RE | Admit: 2020-12-23 | Discharge: 2020-12-23 | Disposition: A | Discharge status: Home / Self Care | Payer: Medicare HMO | Source: Ambulatory Visit | Attending: Family Medicine | Admitting: Family Medicine

## 2020-12-23 ENCOUNTER — Other Ambulatory Visit: Payer: Self-pay

## 2020-12-23 ENCOUNTER — Encounter (HOSPITAL_COMMUNITY): Payer: Self-pay

## 2020-12-23 DIAGNOSIS — Z1231 Encounter for screening mammogram for malignant neoplasm of breast: Secondary | ICD-10-CM | POA: Insufficient documentation

## 2021-05-27 ENCOUNTER — Other Ambulatory Visit (HOSPITAL_COMMUNITY): Payer: Self-pay | Admitting: Adult Health

## 2021-05-27 DIAGNOSIS — Z13820 Encounter for screening for osteoporosis: Secondary | ICD-10-CM

## 2021-06-02 ENCOUNTER — Encounter: Payer: Self-pay | Admitting: Internal Medicine

## 2021-06-02 ENCOUNTER — Other Ambulatory Visit: Payer: Self-pay

## 2021-06-02 ENCOUNTER — Ambulatory Visit (HOSPITAL_COMMUNITY)
Admission: RE | Admit: 2021-06-02 | Discharge: 2021-06-02 | Disposition: A | Discharge status: Home / Self Care | Payer: Medicare HMO | Source: Ambulatory Visit | Attending: Adult Health | Admitting: Adult Health

## 2021-06-02 DIAGNOSIS — Z13820 Encounter for screening for osteoporosis: Secondary | ICD-10-CM | POA: Insufficient documentation

## 2021-06-02 DIAGNOSIS — Z780 Asymptomatic menopausal state: Secondary | ICD-10-CM | POA: Insufficient documentation

## 2021-09-28 ENCOUNTER — Ambulatory Visit: Payer: Medicare HMO | Admitting: Gastroenterology

## 2021-09-28 ENCOUNTER — Encounter: Payer: Self-pay | Admitting: *Deleted

## 2021-09-28 ENCOUNTER — Encounter: Payer: Self-pay | Admitting: Gastroenterology

## 2021-09-28 ENCOUNTER — Other Ambulatory Visit: Payer: Self-pay

## 2021-09-28 DIAGNOSIS — Z1211 Encounter for screening for malignant neoplasm of colon: Secondary | ICD-10-CM

## 2021-09-28 MED ORDER — PEG 3350-KCL-NA BICARB-NACL 420 G PO SOLR: ORAL | 0 refills | Status: DC

## 2021-09-28 NOTE — Patient Instructions (Signed)
We are arranging a colonoscopy in the near future.  Please do not take metformin the day of the procedure.  Further recommendations to follow!  It was a pleasure to see you today. I want to create trusting relationships with patients to provide genuine, compassionate, and quality care. I value your feedback. If you receive a survey regarding your visit,  I greatly appreciate you taking time to fill this out.   [name], PhD, ANP-BC [facility]

## 2021-09-28 NOTE — H&P (View-Only) (Signed)
Primary Care Physician:  Jani Gravel, MD Referring Physician: Dr. Jani Gravel  Primary Gastroenterologist:  Dr. Abbey Chatters  Chief Complaint  Patient presents with   Colonoscopy    Never had tcs    HPI:   Crystal Coleman is a 65 y.o. female presenting today at the request of Dr. Maudie Mercury for initial screening colonoscopy. No family history of colorectal cancer or polyps.   No overt GI bleeding, constipation, diarrhea, changes in bowel habits, abdominal pain, N/V, dysphagia, lack of appetite, or unintentional weight loss. No concerns today.   Past Medical History:  Diagnosis Date   Arthritis    Borderline diabetes    Breast cyst    Complication of anesthesia    Hypertension    Peripheral edema    PONV (postoperative nausea and vomiting)     Past Surgical History:  Procedure Laterality Date   APPENDECTOMY     BREAST EXCISIONAL BIOPSY Right    benign   BREAST LUMPECTOMY WITH RADIOACTIVE SEED LOCALIZATION Right 12/02/2019   Procedure: RIGHT BREAST LUMPECTOMY WITH RADIOACTIVE SEED LOCALIZATION;  Surgeon: Coralie Keens, MD;  Location: [place];  Service: General;  Laterality: Right;   CHOLECYSTECTOMY  1990   TUBAL LIGATION      Current Outpatient Medications  Medication Sig Dispense Refill   aspirin EC 81 MG tablet Take 81 mg by mouth daily.     carvedilol (COREG) 6.25 MG tablet Take 6.25 mg by mouth 2 (two) times daily with a meal.     Cholecalciferol (VITAMIN D3) 50 MCG (2000 UT) capsule Take 2,000 Units by mouth daily.     furosemide (LASIX) 20 MG tablet Take 10 mg by mouth as needed.     hydrALAZINE (APRESOLINE) 50 MG tablet Take 50 mg by mouth 2 (two) times daily.      ibuprofen (ADVIL,MOTRIN) 200 MG tablet Take 200 mg by mouth as needed for moderate pain.     metFORMIN (GLUCOPHAGE) 500 MG tablet Take 500 mg by mouth daily.     Multiple Vitamins-Minerals (CENTRUM SILVER 50+WOMEN PO) Take by mouth daily.     oxymetazoline (AFRIN) 0.05 % nasal spray Place 2  sprays into both nostrils as needed for congestion.     quinapril (ACCUPRIL) 40 MG tablet Take 40 mg by mouth 2 (two) times daily.     No current facility-administered medications for this visit.    Allergies as of 09/28/2021 - Review Complete 09/28/2021  Allergen Reaction Noted   Penicillins  12/06/2016    Family History  Problem Relation Age of Onset   Dementia Mother    Cancer Father    Colon cancer Neg Hx    Colon polyps Neg Hx     Social History   Socioeconomic History   Marital status: Married    Spouse name: Not on file   Number of children: Not on file   Years of education: Not on file   Highest education level: Not on file  Occupational History   Not on file  Tobacco Use   Smoking status: Never   Smokeless tobacco: Never  Substance and Sexual Activity   Alcohol use: No   Drug use: No   Sexual activity: Yes  Other Topics Concern   Not on file  Social History Narrative   Not on file   Social Determinants of Health   Financial Resource Strain: Not on file  Food Insecurity: Not on file  Transportation Needs: Not on file  Physical Activity: Not  on file  Stress: Not on file  Social Connections: Not on file  Intimate Partner Violence: Not on file    Review of Systems: Gen: Denies any fever, chills, fatigue, weight loss, lack of appetite.  CV: Denies chest pain, heart palpitations, peripheral edema, syncope.  Resp: Denies shortness of breath at rest or with exertion. Denies wheezing or cough.  GI: see HPI  GU : Denies urinary burning, urinary frequency, urinary hesitancy MS: Denies joint pain, muscle weakness, cramps, or limitation of movement.  Derm: Denies rash, itching, dry skin Psych: Denies depression, anxiety, memory loss, and confusion Heme: Denies bruising, bleeding, and enlarged lymph nodes.  Physical Exam: BP (!) 146/92   Pulse 65   Temp 97.7 F (36.5 C) (Temporal)   Ht 5\' 3"  (1.6 m)   Wt 237 lb (107.5 kg)   BMI 41.98 kg/m  General:    Alert and oriented. Pleasant and cooperative. Well-nourished and well-developed.  Head:  Normocephalic and atraumatic. Eyes:  Without icterus, sclera clear and conjunctiva pink.  Ears:  Normal auditory acuity. Mouth:  mask in place Lungs:  Clear to auscultation bilaterally. No wheezes, rales, or rhonchi. No distress.  Heart:  S1, S2 present without murmurs appreciated.  Abdomen:  +BS, soft, non-tender and non-distended. No HSM noted. No guarding or rebound. No masses appreciated.  Rectal:  Deferred  Msk:  Symmetrical without gross deformities. Normal posture. Extremities:  Without edema. Neurologic:  Alert and  oriented x4;  grossly normal neurologically. Skin:  Intact without significant lesions or rashes. Psych:  Alert and cooperative. Normal mood and affect.  ASSESSMENT: Crystal Coleman is a 65 y.o. female presenting today with need for initial screening colonoscopy. She has no concerning lower or upper GI signs/symptoms. No family history of colorectal cancer or polyps.    PLAN:  Proceed with colonoscopy by Dr. Abbey Chatters in near future: the risks, benefits, and alternatives have been discussed with the patient in detail. The patient states understanding and desires to proceed. ASA 3 due to BMI Further recommendations to follow   [name], PhD, ANP-BC [facility]

## 2021-09-28 NOTE — Progress Notes (Signed)
Primary Care Physician:  Jani Gravel, MD Referring Physician: Dr. Jani Gravel  Primary Gastroenterologist:  Dr. Abbey Chatters  Chief Complaint  Patient presents with   Colonoscopy    Never had tcs    HPI:   Crystal Coleman is a 65 y.o. female presenting today at the request of Dr. Maudie Mercury for initial screening colonoscopy. No family history of colorectal cancer or polyps.   No overt GI bleeding, constipation, diarrhea, changes in bowel habits, abdominal pain, N/V, dysphagia, lack of appetite, or unintentional weight loss. No concerns today.   Past Medical History:  Diagnosis Date   Arthritis    Borderline diabetes    Breast cyst    Complication of anesthesia    Hypertension    Peripheral edema    PONV (postoperative nausea and vomiting)     Past Surgical History:  Procedure Laterality Date   APPENDECTOMY     BREAST EXCISIONAL BIOPSY Right    benign   BREAST LUMPECTOMY WITH RADIOACTIVE SEED LOCALIZATION Right 12/02/2019   Procedure: RIGHT BREAST LUMPECTOMY WITH RADIOACTIVE SEED LOCALIZATION;  Surgeon: Coralie Keens, MD;  Location: [place];  Service: General;  Laterality: Right;   CHOLECYSTECTOMY  1990   TUBAL LIGATION      Current Outpatient Medications  Medication Sig Dispense Refill   aspirin EC 81 MG tablet Take 81 mg by mouth daily.     carvedilol (COREG) 6.25 MG tablet Take 6.25 mg by mouth 2 (two) times daily with a meal.     Cholecalciferol (VITAMIN D3) 50 MCG (2000 UT) capsule Take 2,000 Units by mouth daily.     furosemide (LASIX) 20 MG tablet Take 10 mg by mouth as needed.     hydrALAZINE (APRESOLINE) 50 MG tablet Take 50 mg by mouth 2 (two) times daily.      ibuprofen (ADVIL,MOTRIN) 200 MG tablet Take 200 mg by mouth as needed for moderate pain.     metFORMIN (GLUCOPHAGE) 500 MG tablet Take 500 mg by mouth daily.     Multiple Vitamins-Minerals (CENTRUM SILVER 50+WOMEN PO) Take by mouth daily.     oxymetazoline (AFRIN) 0.05 % nasal spray Place 2  sprays into both nostrils as needed for congestion.     quinapril (ACCUPRIL) 40 MG tablet Take 40 mg by mouth 2 (two) times daily.     No current facility-administered medications for this visit.    Allergies as of 09/28/2021 - Review Complete 09/28/2021  Allergen Reaction Noted   Penicillins  12/06/2016    Family History  Problem Relation Age of Onset   Dementia Mother    Cancer Father    Colon cancer Neg Hx    Colon polyps Neg Hx     Social History   Socioeconomic History   Marital status: Married    Spouse name: Not on file   Number of children: Not on file   Years of education: Not on file   Highest education level: Not on file  Occupational History   Not on file  Tobacco Use   Smoking status: Never   Smokeless tobacco: Never  Substance and Sexual Activity   Alcohol use: No   Drug use: No   Sexual activity: Yes  Other Topics Concern   Not on file  Social History Narrative   Not on file   Social Determinants of Health   Financial Resource Strain: Not on file  Food Insecurity: Not on file  Transportation Needs: Not on file  Physical Activity: Not  on file  Stress: Not on file  Social Connections: Not on file  Intimate Partner Violence: Not on file    Review of Systems: Gen: Denies any fever, chills, fatigue, weight loss, lack of appetite.  CV: Denies chest pain, heart palpitations, peripheral edema, syncope.  Resp: Denies shortness of breath at rest or with exertion. Denies wheezing or cough.  GI: see HPI  GU : Denies urinary burning, urinary frequency, urinary hesitancy MS: Denies joint pain, muscle weakness, cramps, or limitation of movement.  Derm: Denies rash, itching, dry skin Psych: Denies depression, anxiety, memory loss, and confusion Heme: Denies bruising, bleeding, and enlarged lymph nodes.  Physical Exam: BP (!) 146/92   Pulse 65   Temp 97.7 F (36.5 C) (Temporal)   Ht 5\' 3"  (1.6 m)   Wt 237 lb (107.5 kg)   BMI 41.98 kg/m  General:    Alert and oriented. Pleasant and cooperative. Well-nourished and well-developed.  Head:  Normocephalic and atraumatic. Eyes:  Without icterus, sclera clear and conjunctiva pink.  Ears:  Normal auditory acuity. Mouth:  mask in place Lungs:  Clear to auscultation bilaterally. No wheezes, rales, or rhonchi. No distress.  Heart:  S1, S2 present without murmurs appreciated.  Abdomen:  +BS, soft, non-tender and non-distended. No HSM noted. No guarding or rebound. No masses appreciated.  Rectal:  Deferred  Msk:  Symmetrical without gross deformities. Normal posture. Extremities:  Without edema. Neurologic:  Alert and  oriented x4;  grossly normal neurologically. Skin:  Intact without significant lesions or rashes. Psych:  Alert and cooperative. Normal mood and affect.  ASSESSMENT: Crystal Coleman is a 65 y.o. female presenting today with need for initial screening colonoscopy. She has no concerning lower or upper GI signs/symptoms. No family history of colorectal cancer or polyps.    PLAN:  Proceed with colonoscopy by Dr. Abbey Chatters in near future: the risks, benefits, and alternatives have been discussed with the patient in detail. The patient states understanding and desires to proceed. ASA 3 due to BMI Further recommendations to follow   [name], PhD, ANP-BC [facility]

## 2021-09-29 ENCOUNTER — Telehealth: Payer: Self-pay | Admitting: *Deleted

## 2021-09-29 ENCOUNTER — Encounter: Payer: Self-pay | Admitting: *Deleted

## 2021-09-29 NOTE — Telephone Encounter (Signed)
Called pt. Made aware of pre-op appt details. Letter also mailed

## 2021-10-13 ENCOUNTER — Other Ambulatory Visit: Payer: Self-pay

## 2021-10-13 ENCOUNTER — Encounter (HOSPITAL_COMMUNITY)
Admission: RE | Admit: 2021-10-13 | Discharge: 2021-10-13 | Disposition: A | Discharge status: Home / Self Care | Payer: Medicare HMO | Source: Ambulatory Visit | Attending: Internal Medicine | Admitting: Internal Medicine

## 2021-10-13 ENCOUNTER — Encounter (HOSPITAL_COMMUNITY): Payer: Self-pay

## 2021-10-13 VITALS — BP 165/89 | HR 59 | Temp 97.8°F | Resp 18 | Ht 63.0 in | Wt 234.0 lb

## 2021-10-13 DIAGNOSIS — Z01818 Encounter for other preprocedural examination: Secondary | ICD-10-CM | POA: Insufficient documentation

## 2021-10-13 DIAGNOSIS — R7303 Prediabetes: Secondary | ICD-10-CM | POA: Insufficient documentation

## 2021-10-13 HISTORY — DX: Type 2 diabetes mellitus without complications: E11.9

## 2021-10-13 LAB — BASIC METABOLIC PANEL
Anion gap: 6 (ref 5–15)
BUN: 18 mg/dL (ref 8–23)
CO2: 28 mmol/L (ref 22–32)
Calcium: 9.2 mg/dL (ref 8.9–10.3)
Chloride: 106 mmol/L (ref 98–111)
Creatinine, Ser: 0.82 mg/dL (ref 0.44–1.00)
GFR, Estimated: >60 mL/min (ref >60)
Glucose, Bld: 84 mg/dL (ref 70–99)
Potassium: 4.8 mmol/L (ref 3.5–5.1)
Sodium: 140 mmol/L (ref 135–145)

## 2021-10-13 IMAGING — MG MM DIGITAL SCREENING BILAT W/ TOMO AND CAD
6 of 10 series · 6 of 30 positions shown · non-contrast
Comparison: Previous exam(s).

CLINICAL DATA: Screening.

EXAM:
DIGITAL SCREENING BILATERAL MAMMOGRAM WITH TOMOSYNTHESIS AND CAD
TECHNIQUE: Bilateral screening digital craniocaudal and mediolateral oblique
mammograms were obtained. Bilateral screening digital breast
tomosynthesis was performed. The images were evaluated with
computer-aided detection.

[L MLO synth-2D (1 of 2)]
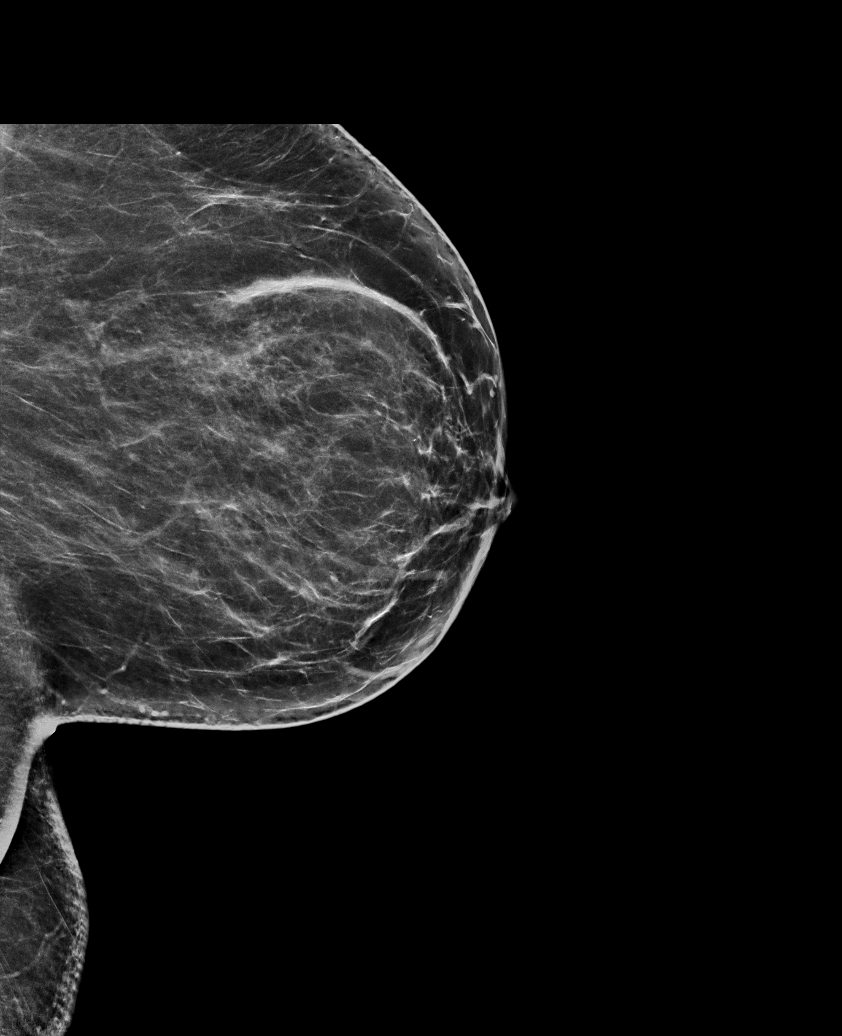

[R CC synth-2D]
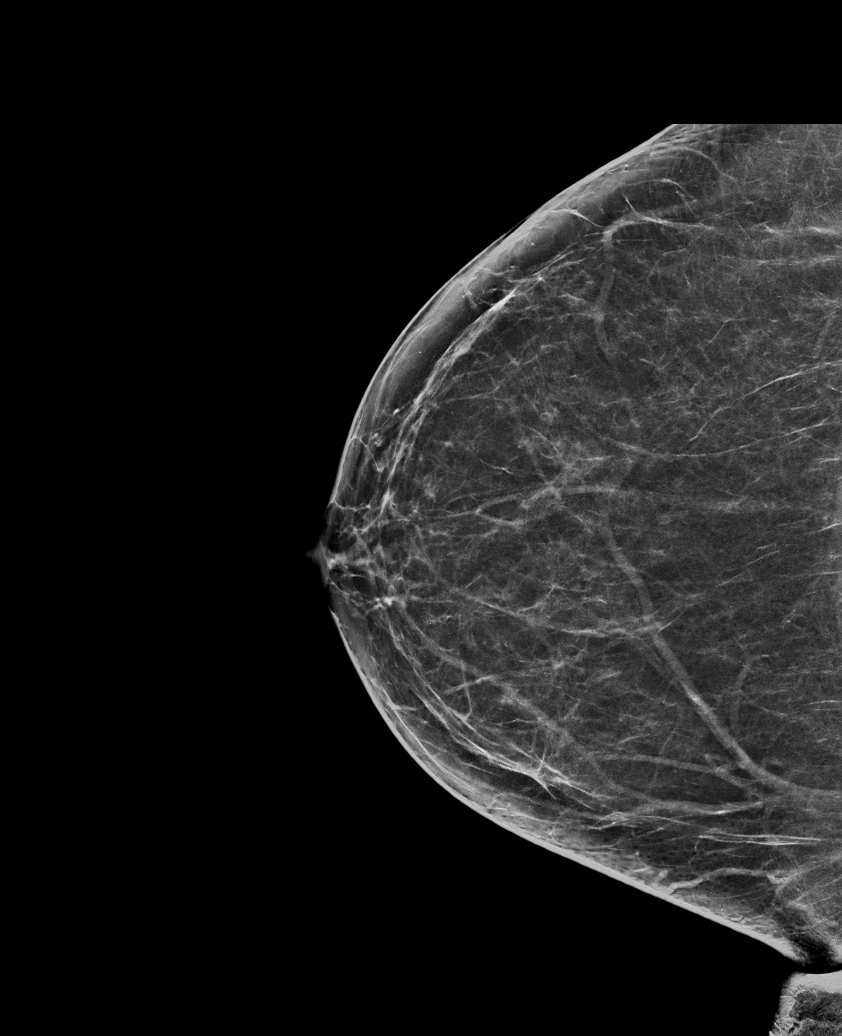

[L CC synth-2D]
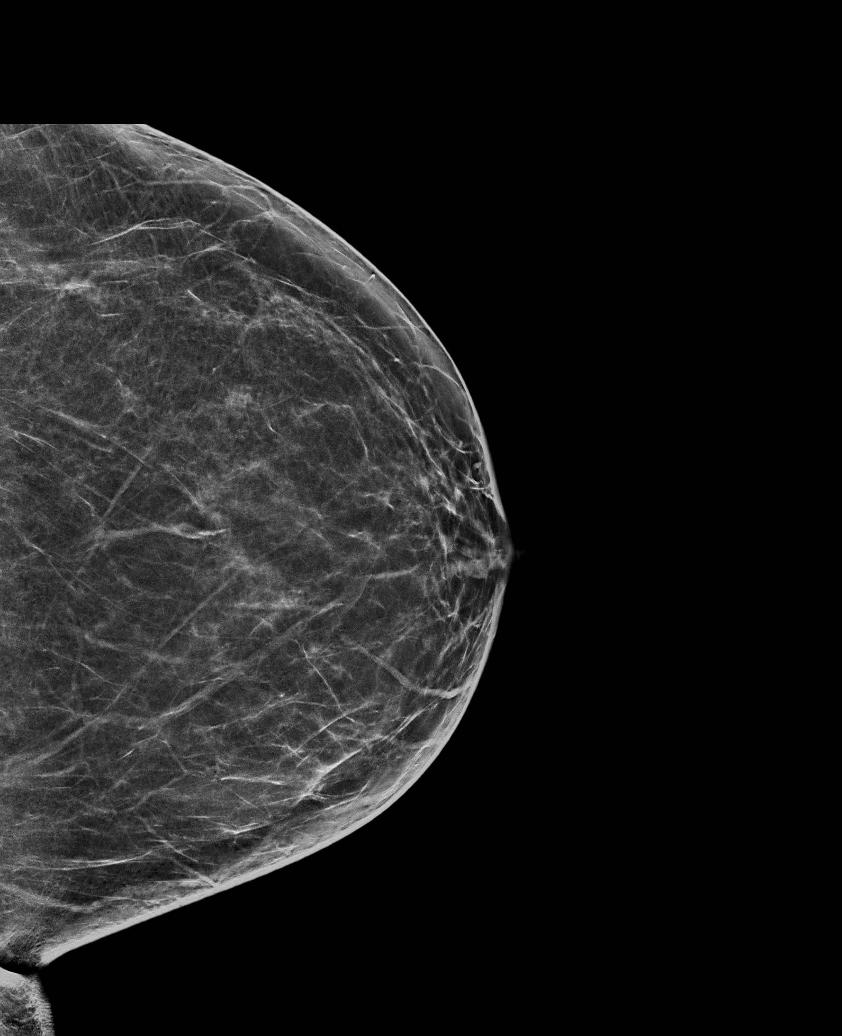

[R MLO synth-2D]
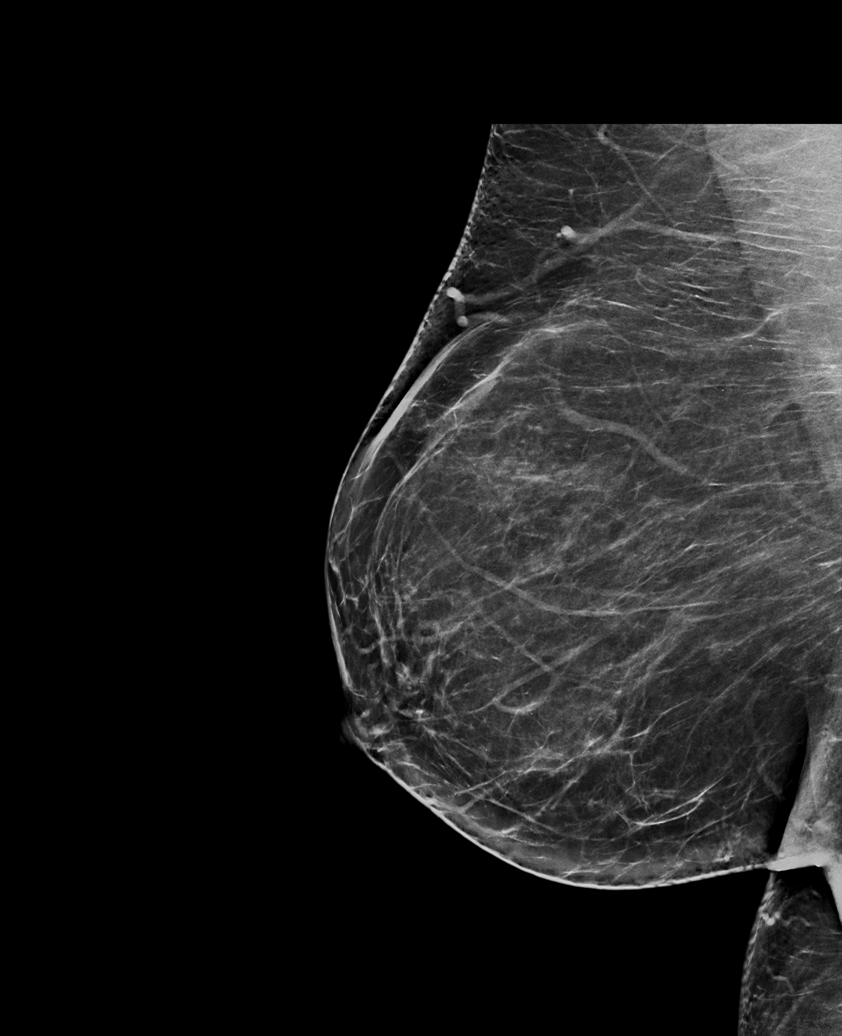

[L MLO synth-2D (2 of 2)]
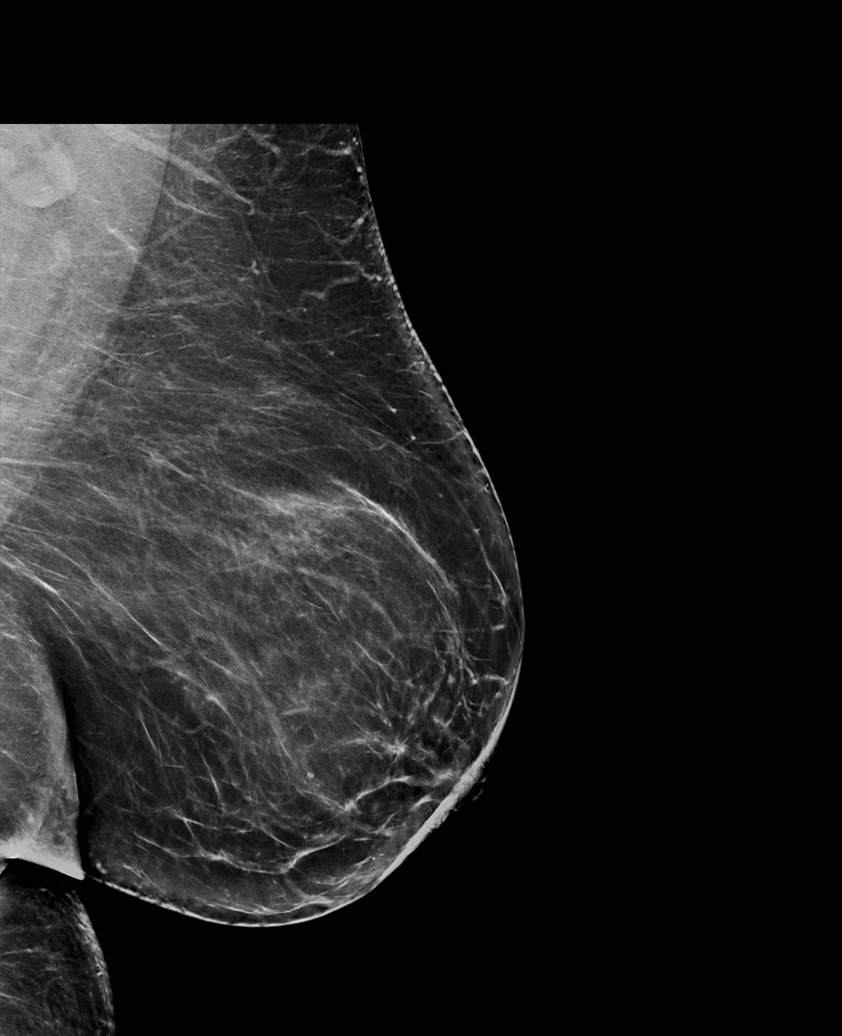

[L MLO tomo · tomo slice 40/79.0]
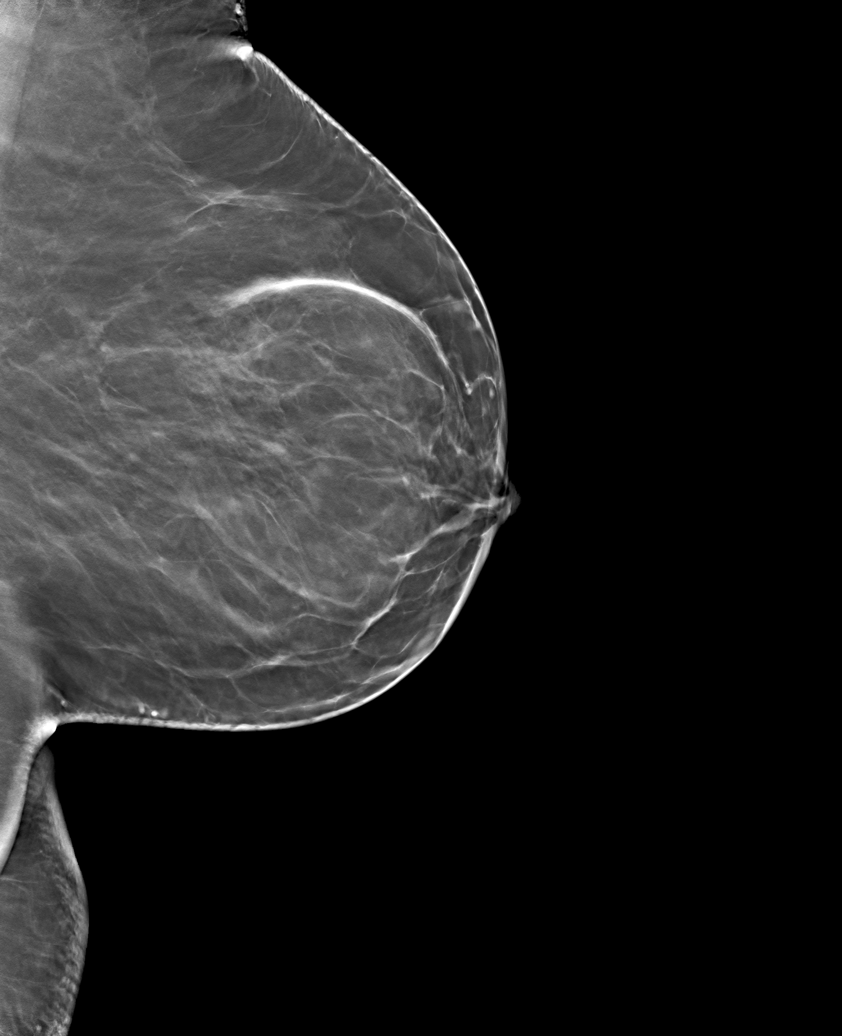

[6 of 30 positions shown; findings below may reference images not displayed]

ACR Breast Density Category b: There are scattered areas of
fibroglandular density.
FINDINGS: There are no findings suspicious for malignancy.
IMPRESSION: No mammographic evidence of malignancy. A result letter of this
screening mammogram will be mailed directly to the patient.

RECOMMENDATION:
Screening mammogram in one year. (Code:51-O-LD2)

BI-RADS CATEGORY  1: Negative.

## 2021-10-13 NOTE — Patient Instructions (Signed)
Crystal Coleman  10/13/2021     @PREFPERIOPPHARMACY @   Your procedure is scheduled on 10/17/2021.   Report to [facility] at  [place].   Call this number if you have problems the morning of surgery:  [phone]   Remember:  Follow the diet and prep instructions given to you by the office.    Take these medicines the morning of surgery with A SIP OF WATER                                carvediolol.     Do not wear jewelry, make-up or nail polish.  Do not wear lotions, powders, or perfumes, or deodorant.  Do not shave 48 hours prior to surgery.  Men may shave face and neck.  Do not bring valuables to the hospital.  [facility] is not responsible for any belongings or valuables.  Contacts, dentures or bridgework may not be worn into surgery.  Leave your suitcase in the car.  After surgery it may be brought to your room.  For patients admitted to the hospital, discharge time will be determined by your treatment team.  Patients discharged the day of surgery will not be allowed to drive home and must have someone with them for 24 hours.    Special instructions:   DO NOT smoke tobacco or vape for 24 hours before your procedure.  Please read over the following fact sheets that you were given. Anesthesia Post-op Instructions and Care and Recovery After Surgery      Colonoscopy, Adult, Care After This sheet gives you information about how to care for yourself after your procedure. Your health care provider may also give you more specific instructions. If you have problems or questions, contact your health care provider. What can I expect after the procedure? After the procedure, it is common to have: A small amount of blood in your stool for 24 hours after the procedure. Some gas. Mild cramping or bloating of your abdomen. Follow these instructions at home: Eating and drinking  Drink enough fluid to keep your urine pale yellow. Follow instructions from your  health care provider about eating or drinking restrictions. Resume your normal diet as instructed by your health care provider. Avoid heavy or fried foods that are hard to digest. Activity Rest as told by your health care provider. Avoid sitting for a long time without moving. Get up to take short walks every 1-2 hours. This is important to improve blood flow and breathing. Ask for help if you feel weak or unsteady. Return to your normal activities as told by your health care provider. Ask your health care provider what activities are safe for you. Managing cramping and bloating  Try walking around when you have cramps or feel bloated. Apply heat to your abdomen as told by your health care provider. Use the heat source that your health care provider recommends, such as a moist heat pack or a heating pad. Place a towel between your skin and the heat source. Leave the heat on for 20-30 minutes. Remove the heat if your skin turns bright red. This is especially important if you are unable to feel pain, heat, or cold. You may have a greater risk of getting burned. General instructions If you were given a sedative during the procedure, it can affect you for several hours. Do not drive or operate machinery until  your health care provider says that it is safe. For the first 24 hours after the procedure: Do not sign important documents. Do not drink alcohol. Do your regular daily activities at a slower pace than normal. Eat soft foods that are easy to digest. Take over-the-counter and prescription medicines only as told by your health care provider. Keep all follow-up visits as told by your health care provider. This is important. Contact a health care provider if: You have blood in your stool 2-3 days after the procedure. Get help right away if you have: More than a small spotting of blood in your stool. Large blood clots in your stool. Swelling of your abdomen. Nausea or vomiting. A  fever. Increasing pain in your abdomen that is not relieved with medicine. Summary After the procedure, it is common to have a small amount of blood in your stool. You may also have mild cramping and bloating of your abdomen. If you were given a sedative during the procedure, it can affect you for several hours. Do not drive or operate machinery until your health care provider says that it is safe. Get help right away if you have a lot of blood in your stool, nausea or vomiting, a fever, or increased pain in your abdomen. This information is not intended to replace advice given to you by your health care provider. Make sure you discuss any questions you have with your health care provider. Document Revised: 09/05/2019 Document Reviewed: 05/26/2019 Elsevier Patient Education  [place] After This sheet gives you information about how to care for yourself after your procedure. Your health care provider may also give you more specific instructions. If you have problems or questions, contact your health care provider. What can I expect after the procedure? After the procedure, it is common to have: Tiredness. Forgetfulness about what happened after the procedure. Impaired judgment for important decisions. Nausea or vomiting. Some difficulty with balance. Follow these instructions at home: For the time period you were told by your health care provider:   Rest as needed. Do not participate in activities where you could fall or become injured. Do not drive or use machinery. Do not drink alcohol. Do not take sleeping pills or medicines that cause drowsiness. Do not make important decisions or sign legal documents. Do not take care of children on your own. Eating and drinking Follow the diet that is recommended by your health care provider. Drink enough fluid to keep your urine pale yellow. If you vomit: Drink water, juice, or soup when you can drink  without vomiting. Make sure you have little or no nausea before eating solid foods. General instructions Have a responsible adult stay with you for the time you are told. It is important to have someone help care for you until you are awake and alert. Take over-the-counter and prescription medicines only as told by your health care provider. If you have sleep apnea, surgery and certain medicines can increase your risk for breathing problems. Follow instructions from your health care provider about wearing your sleep device: Anytime you are sleeping, including during daytime naps. While taking prescription pain medicines, sleeping medicines, or medicines that make you drowsy. Avoid smoking. Keep all follow-up visits as told by your health care provider. This is important. Contact a health care provider if: You keep feeling nauseous or you keep vomiting. You feel light-headed. You are still sleepy or having trouble with balance after 24 hours. You develop a rash.  You have a fever. You have redness or swelling around the IV site. Get help right away if: You have trouble breathing. You have new-onset confusion at home. Summary For several hours after your procedure, you may feel tired. You may also be forgetful and have poor judgment. Have a responsible adult stay with you for the time you are told. It is important to have someone help care for you until you are awake and alert. Rest as told. Do not drive or operate machinery. Do not drink alcohol or take sleeping pills. Get help right away if you have trouble breathing, or if you suddenly become confused. This information is not intended to replace advice given to you by your health care provider. Make sure you discuss any questions you have with your health care provider. Document Revised: 07/15/2020 Document Reviewed: 10/02/2019 Elsevier Patient Education  2022 [employer].

## 2021-10-17 ENCOUNTER — Ambulatory Visit (HOSPITAL_COMMUNITY): Payer: Medicare HMO | Admitting: Anesthesiology

## 2021-10-17 ENCOUNTER — Encounter (HOSPITAL_COMMUNITY): Payer: Self-pay

## 2021-10-17 ENCOUNTER — Other Ambulatory Visit: Payer: Self-pay

## 2021-10-17 ENCOUNTER — Ambulatory Visit (HOSPITAL_COMMUNITY)
Admission: RE | Admit: 2021-10-17 | Discharge: 2021-10-17 | Disposition: A | Discharge status: Home / Self Care | Payer: Medicare HMO | Attending: Internal Medicine | Admitting: Internal Medicine

## 2021-10-17 ENCOUNTER — Encounter (HOSPITAL_COMMUNITY)
Admission: RE | Disposition: A | Discharge status: Home / Self Care | Payer: Self-pay | Source: Home / Self Care | Attending: Internal Medicine

## 2021-10-17 DIAGNOSIS — E119 Type 2 diabetes mellitus without complications: Secondary | ICD-10-CM | POA: Diagnosis not present

## 2021-10-17 DIAGNOSIS — I10 Essential (primary) hypertension: Secondary | ICD-10-CM | POA: Diagnosis not present

## 2021-10-17 DIAGNOSIS — Z79899 Other long term (current) drug therapy: Secondary | ICD-10-CM | POA: Insufficient documentation

## 2021-10-17 DIAGNOSIS — Z1211 Encounter for screening for malignant neoplasm of colon: Secondary | ICD-10-CM | POA: Insufficient documentation

## 2021-10-17 DIAGNOSIS — K5730 Diverticulosis of large intestine without perforation or abscess without bleeding: Secondary | ICD-10-CM | POA: Diagnosis not present

## 2021-10-17 DIAGNOSIS — K648 Other hemorrhoids: Secondary | ICD-10-CM | POA: Insufficient documentation

## 2021-10-17 DIAGNOSIS — D122 Benign neoplasm of ascending colon: Secondary | ICD-10-CM | POA: Insufficient documentation

## 2021-10-17 DIAGNOSIS — Z7984 Long term (current) use of oral hypoglycemic drugs: Secondary | ICD-10-CM | POA: Diagnosis not present

## 2021-10-17 DIAGNOSIS — K635 Polyp of colon: Secondary | ICD-10-CM | POA: Diagnosis not present

## 2021-10-17 DIAGNOSIS — Z6841 Body Mass Index (BMI) 40.0 and over, adult: Secondary | ICD-10-CM | POA: Insufficient documentation

## 2021-10-17 HISTORY — PX: POLYPECTOMY: SHX5525

## 2021-10-17 HISTORY — PX: COLONOSCOPY WITH PROPOFOL: SHX5780

## 2021-10-17 LAB — GLUCOSE, CAPILLARY
Glucose-Capillary: 85 mg/dL (ref 70–99)
Glucose-Capillary: 90 mg/dL (ref 70–99)

## 2021-10-17 SURGERY — COLONOSCOPY WITH PROPOFOL
Anesthesia: General

## 2021-10-17 MED ORDER — LIDOCAINE HCL (CARDIAC) PF 100 MG/5ML IV SOSY
PREFILLED_SYRINGE | INTRAVENOUS | Status: DC | PRN
  Administered 2021-10-17: 50 mg via INTRAVENOUS

## 2021-10-17 MED ORDER — LACTATED RINGERS IV SOLN: INTRAVENOUS | Status: DC

## 2021-10-17 MED ORDER — PROPOFOL 10 MG/ML IV BOLUS
INTRAVENOUS | Status: DC | PRN
  Administered 2021-10-17: 40 mg via INTRAVENOUS
  Administered 2021-10-17: 120 mg via INTRAVENOUS

## 2021-10-17 NOTE — Op Note (Signed)
[facility] Patient Name: Crystal Coleman Procedure Date: 10/17/2021 9:52 AM MRN: [id number] Date of Birth: October 27, 1956 Attending MD: Elon Alas. Edgar Frisk CSN: [id number] Age: 65 Admit Type: Outpatient Procedure:                Colonoscopy Indications:              Screening for colorectal malignant neoplasm Providers:                Elon Alas. Abbey Chatters, DO, Charlsie Quest. Theda Sers RN, RN,                            Nelma Rothman, Technician Referring MD:              Medicines:                See the Anesthesia note for documentation of the                            administered medications Complications:            No immediate complications. Estimated Blood Loss:     Estimated blood loss was minimal. Procedure:                Pre-Anesthesia Assessment:                           - The anesthesia plan was to use monitored                            anesthesia care (MAC).                           After obtaining informed consent, the colonoscope                            was passed under direct vision. Throughout the                            procedure, the patient's blood pressure, pulse, and                            oxygen saturations were monitored continuously. The                            [id number]-HQ190L ([id number]) scope was introduced through                            the anus and advanced to the the cecum, identified                            by appendiceal orifice and ileocecal valve. The                            colonoscopy was performed without difficulty. The                            patient tolerated the procedure  well. The quality                            of the bowel preparation was evaluated using the                            BBPS [facility] Bowel Preparation Scale) with scores                            of: Right Colon = 3, Transverse Colon = 3 and Left                            Colon = 3 (entire mucosa seen well with no residual                             staining, small fragments of stool or opaque                            liquid). The total BBPS score equals 9. Scope In: 9:54:42 AM Scope Out: 10:08:01 AM Scope Withdrawal Time: 0 hours 9 minutes 43 seconds  Total Procedure Duration: 0 hours 13 minutes 19 seconds  Findings:      The perianal and digital rectal examinations were normal.      Non-bleeding internal hemorrhoids were found during endoscopy.      Multiple medium-mouthed diverticula were found in the sigmoid colon.      Two flat polyps were found in the ascending colon. The polyps were 5 to       7 mm in size. These polyps were removed with a cold snare. Resection and       retrieval were complete.      The exam was otherwise without abnormality. Impression:               - Non-bleeding internal hemorrhoids.                           - Diverticulosis in the sigmoid colon.                           - Two 5 to 7 mm polyps in the ascending colon,                            removed with a cold snare. Resected and retrieved.                           - The examination was otherwise normal. Moderate Sedation:      Per Anesthesia Care Recommendation:           - Patient has a contact number available for                            emergencies. The signs and symptoms of potential                            delayed complications were discussed with the  patient. Return to normal activities tomorrow.                            Written discharge instructions were provided to the                            patient.                           - Resume previous diet.                           - Continue present medications.                           - Await pathology results.                           - Repeat colonoscopy in 5 years for surveillance.                           - Return to GI clinic PRN. Procedure Code(s):        --- Professional ---                           [phone], Colonoscopy, flexible; with  removal of                            tumor(s), polyp(s), or other lesion(s) by snare                            technique Diagnosis Code(s):        --- Professional ---                           Z12.11, Encounter for screening for malignant                            neoplasm of colon                           K64.8, Other hemorrhoids                           K63.5, Polyp of colon                           K57.30, Diverticulosis of large intestine without                            perforation or abscess without bleeding CPT copyright 2019 American Medical Association. All rights reserved. The codes documented in this report are preliminary and upon coder review may  be revised to meet current compliance requirements. Elon Alas. Abbey Chatters, DO [place] Bakary Bramer, DO 10/17/2021 10:11:12 AM This report has been signed electronically. Number of Addenda: 0

## 2021-10-17 NOTE — Anesthesia Postprocedure Evaluation (Signed)
Anesthesia Post Note  Patient: Crystal Coleman  Procedure(s) Performed: COLONOSCOPY WITH PROPOFOL POLYPECTOMY  Patient location during evaluation: Phase II Anesthesia Type: General Level of consciousness: awake and alert and oriented Pain management: pain level controlled Vital Signs Assessment: post-procedure vital signs reviewed and stable Respiratory status: spontaneous breathing, nonlabored ventilation and respiratory function stable Cardiovascular status: blood pressure returned to baseline and stable Postop Assessment: no apparent nausea or vomiting Anesthetic complications: no   No notable events documented.   Last Vitals:  Vitals:   10/17/21 0900 10/17/21 1012  BP: 139/89 (!) 108/57  Pulse: (!) 58 (!) 50  Resp: 18 16  Temp: 36.9 C 36.7 C  SpO2: 99% 98%    Last Pain:  Vitals:   10/17/21 1012  TempSrc: Oral  PainSc: 0-No pain                 Willowdean Luhmann C Isaia Hassell

## 2021-10-17 NOTE — Discharge Instructions (Addendum)
  Colonoscopy Discharge Instructions  Read the instructions outlined below and refer to this sheet in the next few weeks. These discharge instructions provide you with general information on caring for yourself after you leave the hospital. Your doctor may also give you specific instructions. While your treatment has been planned according to the most current medical practices available, unavoidable complications occasionally occur.   ACTIVITY You may resume your regular activity, but move at a slower pace for the next 24 hours.  Take frequent rest periods for the next 24 hours.  Walking will help get rid of the air and reduce the bloated feeling in your belly (abdomen).  No driving for 24 hours (because of the medicine (anesthesia) used during the test).   Do not sign any important legal documents or operate any machinery for 24 hours (because of the anesthesia used during the test).  NUTRITION Drink plenty of fluids.  You may resume your normal diet as instructed by your doctor.  Begin with a light meal and progress to your normal diet. Heavy or fried foods are harder to digest and may make you feel sick to your stomach (nauseated).  Avoid alcoholic beverages for 24 hours or as instructed.  MEDICATIONS You may resume your normal medications unless your doctor tells you otherwise.  WHAT YOU CAN EXPECT TODAY Some feelings of bloating in the abdomen.  Passage of more gas than usual.  Spotting of blood in your stool or on the toilet paper.  IF YOU HAD POLYPS REMOVED DURING THE COLONOSCOPY: No aspirin products for 7 days or as instructed.  No alcohol for 7 days or as instructed.  Eat a soft diet for the next 24 hours.  FINDING OUT THE RESULTS OF YOUR TEST Not all test results are available during your visit. If your test results are not back during the visit, make an appointment with your caregiver to find out the results. Do not assume everything is normal if you have not heard from your  caregiver or the medical facility. It is important for you to follow up on all of your test results.  SEEK IMMEDIATE MEDICAL ATTENTION IF: You have more than a spotting of blood in your stool.  Your belly is swollen (abdominal distention).  You are nauseated or vomiting.  You have a temperature over 101.  You have abdominal pain or discomfort that is severe or gets worse throughout the day.   Your colonoscopy revealed 2 polyp(s) which I removed successfully. Await pathology results, my office will contact you. I recommend repeating colonoscopy in 5 years for surveillance purposes.   You also have diverticulosis and internal hemorrhoids. I would recommend increasing fiber in your diet or adding OTC Benefiber/Metamucil. Be sure to drink at least 4 to 6 glasses of water daily. Follow-up with GI as needed.   I hope you have a great rest of your week!  Charles K. Carver, D.O. Gastroenterology and Hepatology [facility]  

## 2021-10-17 NOTE — Interval H&P Note (Signed)
History and Physical Interval Note:  10/17/2021 9:09 AM  Crystal Coleman  has presented today for surgery, with the diagnosis of screening.  The various methods of treatment have been discussed with the patient and family. After consideration of risks, benefits and other options for treatment, the patient has consented to  Procedure(s) with comments: COLONOSCOPY WITH PROPOFOL (N/A) - 10:45am as a surgical intervention.  The patient's history has been reviewed, patient examined, no change in status, stable for surgery.  I have reviewed the patient's chart and labs.  Questions were answered to the patient's satisfaction.     [name]

## 2021-10-17 NOTE — Transfer of Care (Signed)
Immediate Anesthesia Transfer of Care Note  Patient: Crystal Coleman  Procedure(s) Performed: COLONOSCOPY WITH PROPOFOL POLYPECTOMY  Patient Location: Short Stay  Anesthesia Type:General  Level of Consciousness: awake, alert  and oriented  Airway & Oxygen Therapy: Patient Spontanous Breathing  Post-op Assessment: Report given to RN and Post -op Vital signs reviewed and stable  Post vital signs: Reviewed and stable  Last Vitals:  Vitals Value Taken Time  BP    Temp    Pulse    Resp    SpO2      Last Pain:  Vitals:   10/17/21 0950  PainSc: 0-No pain         Complications: No notable events documented.

## 2021-10-17 NOTE — Anesthesia Preprocedure Evaluation (Signed)
Anesthesia Evaluation  Patient identified by MRN, date of birth, ID band Patient awake    Reviewed: Allergy & Precautions, NPO status , Patient's Chart, lab work & pertinent test results  History of Anesthesia Complications (+) PONV and history of anesthetic complications  Airway Mallampati: II  TM Distance: >3 FB Neck ROM: Full    Dental  (+) Dental Advisory Given, Missing   Pulmonary neg pulmonary ROS,    Pulmonary exam normal breath sounds clear to auscultation       Cardiovascular Exercise Tolerance: Good hypertension, Pt. on medications Normal cardiovascular exam Rhythm:Regular Rate:Normal     Neuro/Psych negative neurological ROS  negative psych ROS   GI/Hepatic negative GI ROS, Neg liver ROS,   Endo/Other  diabetes, Well Controlled, Type 2, Oral Hypoglycemic AgentsMorbid obesity  Renal/GU negative Renal ROS     Musculoskeletal  (+) Arthritis , Osteoarthritis,    Abdominal   Peds  Hematology   Anesthesia Other Findings   Reproductive/Obstetrics                             Anesthesia Physical Anesthesia Plan  ASA: 3  Anesthesia Plan: General   Post-op Pain Management: Minimal or no pain anticipated   Induction: Intravenous  PONV Risk Score and Plan: TIVA  Airway Management Planned: Nasal Cannula and Natural Airway  Additional Equipment:   Intra-op Plan:   Post-operative Plan:   Informed Consent: I have reviewed the patients History and Physical, chart, labs and discussed the procedure including the risks, benefits and alternatives for the proposed anesthesia with the patient or authorized representative who has indicated his/her understanding and acceptance.     Dental advisory given  Plan Discussed with: CRNA and Surgeon  Anesthesia Plan Comments:         Anesthesia Quick Evaluation

## 2021-10-17 NOTE — Anesthesia Procedure Notes (Signed)
Date/Time: 10/17/2021 9:57 AM Performed by: Orlie Dakin, CRNA Pre-anesthesia Checklist: Patient identified, Emergency Drugs available, Suction available and Patient being monitored Patient Re-evaluated:Patient Re-evaluated prior to induction Oxygen Delivery Method: Nasal cannula Induction Type: IV induction Placement Confirmation: positive ETCO2

## 2021-10-18 LAB — SURGICAL PATHOLOGY

## 2021-10-20 ENCOUNTER — Encounter (HOSPITAL_COMMUNITY): Payer: Self-pay | Admitting: Internal Medicine

## 2022-12-19 ENCOUNTER — Other Ambulatory Visit (HOSPITAL_COMMUNITY): Payer: Self-pay | Admitting: Family Medicine

## 2022-12-19 DIAGNOSIS — Z1231 Encounter for screening mammogram for malignant neoplasm of breast: Secondary | ICD-10-CM

## 2023-01-04 ENCOUNTER — Other Ambulatory Visit (HOSPITAL_COMMUNITY): Payer: Self-pay | Admitting: Family Medicine

## 2023-01-04 ENCOUNTER — Ambulatory Visit (HOSPITAL_COMMUNITY)
Admission: RE | Admit: 2023-01-04 | Discharge: 2023-01-04 | Disposition: A | Discharge status: Home / Self Care | Payer: Medicare HMO | Source: Ambulatory Visit | Attending: Family Medicine | Admitting: Family Medicine

## 2023-01-04 ENCOUNTER — Encounter (HOSPITAL_COMMUNITY): Payer: Self-pay

## 2023-01-04 DIAGNOSIS — Z1231 Encounter for screening mammogram for malignant neoplasm of breast: Secondary | ICD-10-CM | POA: Diagnosis present

## 2023-01-04 DIAGNOSIS — R945 Abnormal results of liver function studies: Secondary | ICD-10-CM

## 2023-01-04 DIAGNOSIS — R7401 Elevation of levels of liver transaminase levels: Secondary | ICD-10-CM

## 2023-01-16 ENCOUNTER — Ambulatory Visit (HOSPITAL_COMMUNITY)
Admission: RE | Admit: 2023-01-16 | Discharge: 2023-01-16 | Disposition: A | Discharge status: Home / Self Care | Payer: Medicare HMO | Source: Ambulatory Visit | Attending: Family Medicine | Admitting: Family Medicine

## 2023-01-16 DIAGNOSIS — R945 Abnormal results of liver function studies: Secondary | ICD-10-CM | POA: Insufficient documentation

## 2023-01-16 DIAGNOSIS — R7401 Elevation of levels of liver transaminase levels: Secondary | ICD-10-CM | POA: Diagnosis present

## 2023-02-14 ENCOUNTER — Ambulatory Visit (INDEPENDENT_AMBULATORY_CARE_PROVIDER_SITE_OTHER): Payer: Medicare HMO | Admitting: Obstetrics & Gynecology

## 2023-02-14 ENCOUNTER — Encounter: Payer: Self-pay | Admitting: Obstetrics & Gynecology

## 2023-02-14 ENCOUNTER — Other Ambulatory Visit (HOSPITAL_COMMUNITY)
Admission: RE | Admit: 2023-02-14 | Discharge: 2023-02-14 | Disposition: A | Discharge status: Home / Self Care | Payer: Medicare HMO | Source: Ambulatory Visit | Attending: Obstetrics & Gynecology | Admitting: Obstetrics & Gynecology

## 2023-02-14 VITALS — BP 158/84 | HR 56 | Ht 63.0 in | Wt 230.6 lb

## 2023-02-14 DIAGNOSIS — I10 Essential (primary) hypertension: Secondary | ICD-10-CM | POA: Diagnosis not present

## 2023-02-14 DIAGNOSIS — Z0000 Encounter for general adult medical examination without abnormal findings: Secondary | ICD-10-CM | POA: Diagnosis not present

## 2023-02-14 DIAGNOSIS — N950 Postmenopausal bleeding: Secondary | ICD-10-CM

## 2023-02-14 DIAGNOSIS — Z6841 Body Mass Index (BMI) 40.0 and over, adult: Secondary | ICD-10-CM

## 2023-02-14 DIAGNOSIS — N882 Stricture and stenosis of cervix uteri: Secondary | ICD-10-CM | POA: Diagnosis not present

## 2023-02-14 NOTE — Progress Notes (Signed)
GYN VISIT Patient name: Crystal Coleman MRN [id number]  Date of birth: [birth year] Chief Complaint:   Gynecologic Exam  History of Present Illness:   Crystal Coleman is a 67 y.o. G2P0 PM female being seen today for the following concerns:  PMB: Reports that about a month ago she noted occasional pink spotting when she wiped. Lasted for about 3-4 days.  Denies BRB or brown discharge.  No further spotting.  Denies pelvic or abdominal pain.  No urinary concerns.  No other acute complaints  Last pap 2019- neg.  Previously completed by Dr. Emilee Hero- per pt had regular screening that was negative.  No LMP recorded. Patient is postmenopausal.     03/26/2018    9:57 AM  Depression screen PHQ 2/9  Decreased Interest 0  Down, Depressed, Hopeless 0  PHQ - 2 Score 0  Altered sleeping 0  Tired, decreased energy 0  Change in appetite 0  Feeling bad or failure about yourself  0  Trouble concentrating 0  Moving slowly or fidgety/restless 0  Suicidal thoughts 0  PHQ-9 Score 0     Review of Systems:   Pertinent items are noted in HPI Denies fever/chills, dizziness, headaches, visual disturbances, fatigue, shortness of breath, chest pain, abdominal pain, vomiting, no problems with bowel movements, urination, or intercourse unless otherwise stated above.  Pertinent History Reviewed:  Reviewed past medical,surgical, social, obstetrical and family history.  Reviewed problem list, medications and allergies. Physical Assessment:   Vitals:   02/14/23 0837 02/14/23 0916  BP: (!) 176/77 (!) 158/84  Pulse: (!) 56   Weight: 230 lb 9.6 oz (104.6 kg)   Height: 5\' 3"  (1.6 m)   Body mass index is 40.85 kg/m.       Physical Examination:   General appearance: alert, well appearing, and in no distress  Psych: mood appropriate, normal affect  Skin: warm & dry   Cardiovascular: normal heart rate noted  Respiratory: normal respiratory effort, no distress  Abdomen: obese, soft, non-tender   Pelvic:  VULVA: normal appearing vulva with no masses, tenderness or lesions, VAGINA: normal appearing vagina with normal color and discharge, no lesions, CERVIX: pink-tinged mucus-like discharge noted at cervical os.  UTERUS: uterus is normal size, shape, consistency and nontender  Extremities: no calf tenderness bilaterally  Chaperone: Celene Squibb    Endometrial Biopsy Procedure Note  Pre-operative Diagnosis: postmenopausal bleeding  Post-operative Diagnosis: same  Procedure Details  The risks (including infection, bleeding, pain, and uterine perforation) and benefits of the procedure were explained to the patient and Written informed consent was obtained.  Antibiotic prophylaxis against endocarditis was not indicated.   The patient was placed in the dorsal lithotomy position.  Bimanual exam showed the uterus to be in the neutral position.  A speculum inserted in the vagina, and the cervix prepped with betadine.     A single tooth tenaculum was applied to the anterior lip of the cervix for stabilization.  Due to cervical stenosis- small dilators were used to gain entry into the uterus.  A Pipelle endometrial aspirator was used to sample the endometrium.  Sample was sent for pathologic examination.  Condition: Stable  Complications: None  Assessment & Plan:  1) Postmenopausal bleeding -reviewed potential underlying causes from atrophy to endometrial cancer -due to discharge noted at cervix advised proceeding with EMB -inform consent obtained and procedure completed as above -further management pending pathology  The patient was advised to call for any fever or for prolonged or severe pain or  bleeding. She was advised to use OTC analgesics as needed for mild to moderate pain. She was advised to avoid vaginal intercourse for 48 hours or until the bleeding has completely stopped.   Return if symptoms worsen or fail to improve, for to be determined.   Janyth Pupa, DO Attending  [place], [facility] for [employer], [place]

## 2023-02-15 ENCOUNTER — Other Ambulatory Visit: Payer: Self-pay | Admitting: Obstetrics & Gynecology

## 2023-02-15 DIAGNOSIS — N950 Postmenopausal bleeding: Secondary | ICD-10-CM

## 2023-02-15 LAB — SURGICAL PATHOLOGY

## 2023-02-15 NOTE — Progress Notes (Addendum)
Pt called with results Inadequate endometrial sampling Plan for pelvic ultrasound  Myna Hidalgo, DO Attending Obstetrician & Gynecologist, [facility] for [facility], [facility]

## 2023-02-16 NOTE — Progress Notes (Signed)
Pt called with results, plan to scheduled for pelvic US

## 2023-02-27 ENCOUNTER — Ambulatory Visit (HOSPITAL_COMMUNITY)
Admission: RE | Admit: 2023-02-27 | Discharge: 2023-02-27 | Disposition: A | Discharge status: Home / Self Care | Payer: Medicare HMO | Source: Ambulatory Visit | Attending: Obstetrics & Gynecology | Admitting: Obstetrics & Gynecology

## 2023-02-27 DIAGNOSIS — N950 Postmenopausal bleeding: Secondary | ICD-10-CM | POA: Insufficient documentation

## 2023-03-01 ENCOUNTER — Other Ambulatory Visit: Payer: Self-pay | Admitting: Obstetrics & Gynecology

## 2023-03-01 DIAGNOSIS — N882 Stricture and stenosis of cervix uteri: Secondary | ICD-10-CM

## 2023-03-01 MED ORDER — MISOPROSTOL 200 MCG PO TABS: ORAL_TABLET | ORAL | 0 refills | Status: AC

## 2023-03-01 NOTE — Progress Notes (Signed)
-  Called patient to review results of pelvic ultrasound -Endometrial biopsy indicated.  Discussed options of repeat attempt in office with premedication versus hysteroscopy D&C.  After much discussion patient desires office procedure, she is aware that if unsuccessful then surgical intervention will be indicated  Myna Hidalgo, DO Attending Obstetrician & Gynecologist, [facility] for [employer], [facility]

## 2023-03-14 ENCOUNTER — Other Ambulatory Visit (HOSPITAL_COMMUNITY)
Admission: RE | Admit: 2023-03-14 | Discharge: 2023-03-14 | Disposition: A | Discharge status: Home / Self Care | Payer: Medicare HMO | Source: Ambulatory Visit | Attending: Obstetrics & Gynecology | Admitting: Obstetrics & Gynecology

## 2023-03-14 ENCOUNTER — Ambulatory Visit: Payer: Medicare HMO | Admitting: Obstetrics & Gynecology

## 2023-03-14 ENCOUNTER — Encounter: Payer: Self-pay | Admitting: Obstetrics & Gynecology

## 2023-03-14 VITALS — BP 169/82 | HR 58 | Ht 63.0 in | Wt 230.0 lb

## 2023-03-14 DIAGNOSIS — N882 Stricture and stenosis of cervix uteri: Secondary | ICD-10-CM | POA: Diagnosis not present

## 2023-03-14 DIAGNOSIS — N950 Postmenopausal bleeding: Secondary | ICD-10-CM | POA: Insufficient documentation

## 2023-03-14 DIAGNOSIS — Z6841 Body Mass Index (BMI) 40.0 and over, adult: Secondary | ICD-10-CM

## 2023-03-14 MED ORDER — MEDROXYPROGESTERONE ACETATE 10 MG PO TABS
10.0000 mg | ORAL_TABLET | Freq: Every day | ORAL | 0 refills | Status: AC
Start: 2023-03-14 — End: ?

## 2023-03-14 NOTE — Progress Notes (Signed)
GYN VISIT Patient name: Crystal Coleman MRN [id number]  Date of birth: 1956/09/07 Chief Complaint:   Procedure (Endo bx)  History of Present Illness:   Crystal Coleman is a 67 y.o. PM female being seen today for follow-up regarding the following:  Postmenopausal bleeding: Patient was seen on 02/14/2023, EMB was attempted but unsuccessful.  Patient was given management options and she desired repeat EMB with premedication.   Patient notes considerable pelvic pain and cramping since starting Cytotec.  Prior to the Cytotec she had not had any further vaginal bleeding.  She reports no other acute complaints or concerns since her prior visit  No LMP recorded. Patient is postmenopausal.     03/26/2018    9:57 AM  Depression screen PHQ 2/9  Decreased Interest 0  Down, Depressed, Hopeless 0  PHQ - 2 Score 0  Altered sleeping 0  Tired, decreased energy 0  Change in appetite 0  Feeling bad or failure about yourself  0  Trouble concentrating 0  Moving slowly or fidgety/restless 0  Suicidal thoughts 0  PHQ-9 Score 0     Review of Systems:   Pertinent items are noted in HPI Denies fever/chills, dizziness, headaches, visual disturbances, fatigue, shortness of breath, chest pain, abdominal pain, vomiting, no problems with bowel movements, urination, or intercourse unless otherwise stated above.  Pertinent History Reviewed:  Reviewed past medical,surgical, social, obstetrical and family history.  Reviewed problem list, medications and allergies. Physical Assessment:   Vitals:   03/14/23 1111  BP: (!) 169/82  Pulse: (!) 58  Weight: 230 lb (104.3 kg)  Height: 5\' 3"  (1.6 m)  Body mass index is 40.74 kg/m.       Physical Examination:   General appearance: alert, well appearing, and in no distress  Psych: mood appropriate, normal affect  Skin: warm & dry   Cardiovascular: normal heart rate noted  Respiratory: normal respiratory effort, no distress  Abdomen: soft, non-tender    Pelvic: VULVA: normal appearing vulva with no masses, tenderness or lesions, VAGINA: normal appearing vagina with normal color and discharge, no lesions, CERVIX: normal appearing cervix without discharge or lesions  Extremities: no edema   Chaperone: Faith Rogue    Endometrial Biopsy Procedure Note  Pre-operative Diagnosis: PMB  Post-operative Diagnosis: same  Procedure Details    The risks (including infection, bleeding, pain, and uterine perforation) and benefits of the procedure were explained to the patient and Written informed consent was obtained.  Antibiotic prophylaxis against endocarditis was not indicated.   The patient was placed in the dorsal lithotomy position.  Bimanual exam showed the uterus to be in the neutral position.  A speculum inserted in the vagina, and the cervix prepped with betadine.     A single tooth tenaculum was applied to the anterior lip of the cervix for stabilization.  Os finder was used and bleeding visible noted from cervical os.  A Pipelle endometrial aspirator was used to sample the endometrium.  Uterus sounded to ~ 6cm and met with some resistance at that length.  On [place], uterine length 9cm- not able to pass pipette further than 6cm.  Sample was sent for pathologic examination.  Condition: Stable  Complications: None   Assessment & Plan:  1) postmenopausal bleeding -repeat EMB obtained and adequate sampling obtained -Discussed findings on exam.  While adequate sampling was obtained, concern for endometrial mass as uterus sounded to 6 (ultrasound measurement uterine length 9cm) -Next step pending results of pathology. -Discussed concern for possible endometrial  carcinoma and potential for surgical intervention -Rx for Provera sent in due to current bleeding  The patient was advised to call for any fever or for prolonged or severe pain or bleeding. She was advised to use OTC analgesics as needed for mild to moderate pain. She was advised to  avoid vaginal intercourse for 48 hours or until the bleeding has completely stopped.  Meds ordered this encounter  Medications   medroxyPROGESTERone (PROVERA) 10 MG tablet    Sig: Take 1 tablet (10 mg total) by mouth daily.    Dispense:  10 tablet    Refill:  0     Return for TBD.   Myna Hidalgo, DO Attending Obstetrician & Gynecologist, [facility] for [employer], [facility]

## 2023-03-19 LAB — SURGICAL PATHOLOGY

## 2023-03-21 ENCOUNTER — Other Ambulatory Visit: Payer: Self-pay | Admitting: Obstetrics & Gynecology

## 2023-03-21 NOTE — Progress Notes (Signed)
Preop orders for surgery

## 2023-03-22 ENCOUNTER — Encounter (HOSPITAL_COMMUNITY)
Admission: RE | Admit: 2023-03-22 | Discharge: 2023-03-22 | Disposition: A | Discharge status: Home / Self Care | Payer: Medicare HMO | Source: Ambulatory Visit | Attending: Obstetrics & Gynecology | Admitting: Obstetrics & Gynecology

## 2023-03-22 ENCOUNTER — Encounter (HOSPITAL_COMMUNITY): Payer: Self-pay

## 2023-03-22 VITALS — BP 169/82 | HR 58 | Resp 18 | Ht 63.0 in | Wt 230.0 lb

## 2023-03-22 DIAGNOSIS — E119 Type 2 diabetes mellitus without complications: Secondary | ICD-10-CM | POA: Diagnosis not present

## 2023-03-22 DIAGNOSIS — I10 Essential (primary) hypertension: Secondary | ICD-10-CM | POA: Insufficient documentation

## 2023-03-22 DIAGNOSIS — Z01818 Encounter for other preprocedural examination: Secondary | ICD-10-CM | POA: Insufficient documentation

## 2023-03-22 LAB — CBC WITH DIFFERENTIAL/PLATELET
Abs Immature Granulocytes: 0.03 10*3/uL (ref 0.00–0.07)
Basophils Absolute: 0 10*3/uL (ref 0.0–0.1)
Basophils Relative: 0 %
Eosinophils Absolute: 0.1 10*3/uL (ref 0.0–0.5)
Eosinophils Relative: 2 %
HCT: 40.8 % (ref 36.0–46.0)
Hemoglobin: 13.8 g/dL (ref 12.0–15.0)
Immature Granulocytes: 0 %
Lymphocytes Relative: 33 %
Lymphs Abs: 2.6 10*3/uL (ref 0.7–4.0)
MCH: 32.9 pg (ref 26.0–34.0)
MCHC: 33.8 g/dL (ref 30.0–36.0)
MCV: 97.1 fL (ref 80.0–100.0)
Monocytes Absolute: 0.8 10*3/uL (ref 0.1–1.0)
Monocytes Relative: 10 %
Neutro Abs: 4.2 10*3/uL (ref 1.7–7.7)
Neutrophils Relative %: 55 %
Platelets: 227 10*3/uL (ref 150–400)
RBC: 4.2 MIL/uL (ref 3.87–5.11)
RDW: 12.9 % (ref 11.5–15.5)
WBC: 7.8 10*3/uL (ref 4.0–10.5)
nRBC: 0 % (ref 0.0–0.2)

## 2023-03-22 LAB — BASIC METABOLIC PANEL
Anion gap: 9 (ref 5–15)
BUN: 15 mg/dL (ref 8–23)
CO2: 24 mmol/L (ref 22–32)
Calcium: 9.1 mg/dL (ref 8.9–10.3)
Chloride: 104 mmol/L (ref 98–111)
Creatinine, Ser: 0.81 mg/dL (ref 0.44–1.00)
GFR, Estimated: >60 mL/min (ref >60)
Glucose, Bld: 87 mg/dL (ref 70–99)
Potassium: 4 mmol/L (ref 3.5–5.1)
Sodium: 137 mmol/L (ref 135–145)

## 2023-03-22 LAB — HEMOGLOBIN A1C
Hgb A1c MFr Bld: 6.4 % — ABNORMAL HIGH (ref 4.8–5.6)
Mean Plasma Glucose: 136.98 mg/dL

## 2023-03-22 NOTE — Patient Instructions (Signed)
Crystal Coleman  03/22/2023     @[email or web address] @   Your procedure is scheduled on  03/27/2023.   Report to [facility] at  0900  A.M.   Call this number if you have problems the morning of surgery:  [phone]  If you experience any cold or flu symptoms such as cough, fever, chills, shortness of breath, etc. between now and your scheduled surgery, please notify us at the above number.   Remember:  Do not eat or drink after midnight.        DO NOT take any medications for diabetes the morning of your procedure.     Take these medicines the morning of surgery with A SIP OF WATER                                        Carvedilol.    Do not wear jewelry, make-up or nail polish.  Do not wear lotions, powders, or perfumes, or deodorant.  Do not shave 48 hours prior to surgery.  Men may shave face and neck.  Do not bring valuables to the hospital.  [facility] is not responsible for any belongings or valuables.  Contacts, dentures or bridgework may not be worn into surgery.  Leave your suitcase in the car.  After surgery it may be brought to your room.  For patients admitted to the hospital, discharge time will be determined by your treatment team.  Patients discharged the day of surgery will not be allowed to drive home and must have someone with them for 24 hours.    Special instructions:  DO NOT smoke tobacco or vape for 24 hours before your procedure.  Please read over the following fact sheets that you were given. Coughing and Deep Breathing, Surgical Site Infection Prevention, Anesthesia Post-op Instructions, and Care and Recovery After Surgery        Dilation and Curettage or Vacuum Curettage, Care After The following information offers guidance on how to care for yourself after your procedure. Your doctor may also give you more specific instructions. If you have problems or questions, contact your doctor. What can I expect after the procedure? After  the procedure, it is common to have: Mild pain or cramps. Some bleeding or spotting from the vagina. These may last for up to 2 weeks. Follow these instructions at home: Medicines Take over-the-counter and prescription medicines only as told by your doctor. If told, take steps to prevent problems with pooping (constipation). You may need to: Drink enough fluid to keep your pee (urine) pale yellow. Take medicines. You will be told what medicines to take. Eat foods that are high in fiber. These include beans, whole grains, and fresh fruits and vegetables. Limit foods that are high in fat and sugar. These include fried or sweet foods. Ask your doctor if you should avoid driving or using machines while you are taking your medicine. Activity  If you were given a medicine to help you relax (sedative) during your procedure, it can affect you for many hours. Do not drive or use machinery until your doctor says that it is safe. Rest as told by your doctor. Get up to take short walks every 1-2 hours. Ask for help if you feel weak or unsteady. Do not lift anything that is heavier than 10 lb (4.5 kg), or the limit that you are told.  Return to your normal activities when your doctor says that it is safe. Lifestyle For at least 2 weeks, or as long as told by your doctor: Do not douche. Do not use tampons. Do not have sex. General instructions Do not take baths, swim, or use a hot tub. Ask your doctor if you may take showers. Do not smoke or use any products that contain nicotine or tobacco. These can delay healing. If you need help quitting, ask your doctor. Wear compression stockings as told by your doctor. It is up to you to get the results of your procedure. Ask how to get your results when they are ready. Keep all follow-up visits. Contact a doctor if: You have very bad cramps that get worse or do not get better with medicine. You have very bad pain in your belly (abdomen). You cannot drink  fluids without vomiting. You have pain in the area just above your thighs. You have fluid from your vagina that smells bad. You have a rash. Get help right away if: You are bleeding a lot from your vagina. This means soaking more than one sanitary pad in 1 hour, and this happens for 2 hours in a row. You have a fever that is above 100.49F (38C). Your belly feels very tender or hard. You have chest pain. You have trouble breathing. You feel dizzy or light-headed. You faint. You have pain in your neck or shoulder area. These symptoms may be an emergency. Get help right away. Call your local emergency services (911 in the [place] Do not wait to see if the symptoms will go away. Do not drive yourself to the hospital. Summary After your procedure, it is common to have pain or cramping. It is also common to have bleeding or spotting from your vagina. Rest as told. Get up to take short walks every 1-2 hours. Do not lift anything that is heavier than 10 lb (4.5 kg), or the limit that you are told. Get help right away if you have problems from the procedure. Ask your doctor what problems to watch for. This information is not intended to replace advice given to you by your health care provider. Make sure you discuss any questions you have with your health care provider. Document Revised: 10/20/2020 Document Reviewed: 10/20/2020 Elsevier Patient Education  2023 [employer] General Anesthesia, Adult, Care After The following information offers guidance on how to care for yourself after your procedure. Your health care provider may also give you more specific instructions. If you have problems or questions, contact your health care provider. What can I expect after the procedure? After the procedure, it is common for people to: Have pain or discomfort at the IV site. Have nausea or vomiting. Have a sore throat or hoarseness. Have trouble concentrating. Feel cold or chills. Feel weak, sleepy,  or tired (fatigue). Have soreness and body aches. These can affect parts of the body that were not involved in surgery. Follow these instructions at home: For the time period you were told by your health care provider:  Rest. Do not participate in activities where you could fall or become injured. Do not drive or use machinery. Do not drink alcohol. Do not take sleeping pills or medicines that cause drowsiness. Do not make important decisions or sign legal documents. Do not take care of children on your own. General instructions Drink enough fluid to keep your urine pale yellow. If you have sleep apnea, surgery and certain medicines can increase your risk  for breathing problems. Follow instructions from your health care provider about wearing your sleep device: Anytime you are sleeping, including during daytime naps. While taking prescription pain medicines, sleeping medicines, or medicines that make you drowsy. Return to your normal activities as told by your health care provider. Ask your health care provider what activities are safe for you. Take over-the-counter and prescription medicines only as told by your health care provider. Do not use any products that contain nicotine or tobacco. These products include cigarettes, chewing tobacco, and vaping devices, such as e-cigarettes. These can delay incision healing after surgery. If you need help quitting, ask your health care provider. Contact a health care provider if: You have nausea or vomiting that does not get better with medicine. You vomit every time you eat or drink. You have pain that does not get better with medicine. You cannot urinate or have bloody urine. You develop a skin rash. You have a fever. Get help right away if: You have trouble breathing. You have chest pain. You vomit blood. These symptoms may be an emergency. Get help right away. Call 911. Do not wait to see if the symptoms will go away. Do not drive  yourself to the hospital. Summary After the procedure, it is common to have a sore throat, hoarseness, nausea, vomiting, or to feel weak, sleepy, or fatigue. For the time period you were told by your health care provider, do not drive or use machinery. Get help right away if you have difficulty breathing, have chest pain, or vomit blood. These symptoms may be an emergency. This information is not intended to replace advice given to you by your health care provider. Make sure you discuss any questions you have with your health care provider. Document Revised: 01/27/2022 Document Reviewed: 01/27/2022 Elsevier Patient Education  2023 [employer] How to Use Chlorhexidine Before Surgery Chlorhexidine gluconate (CHG) is a germ-killing (antiseptic) solution that is used to clean the skin. It can get rid of the bacteria that normally live on the skin and can keep them away for about 24 hours. To clean your skin with CHG, you may be given: A CHG solution to use in the shower or as part of a sponge bath. A prepackaged cloth that contains CHG. Cleaning your skin with CHG may help lower the risk for infection: While you are staying in the intensive care unit of the hospital. If you have a vascular access, such as a central line, to provide short-term or long-term access to your veins. If you have a catheter to drain urine from your bladder. If you are on a ventilator. A ventilator is a machine that helps you breathe by moving air in and out of your lungs. After surgery. What are the risks? Risks of using CHG include: A skin reaction. Hearing loss, if CHG gets in your ears and you have a perforated eardrum. Eye injury, if CHG gets in your eyes and is not rinsed out. The CHG product catching fire. Make sure that you avoid smoking and flames after applying CHG to your skin. Do not use CHG: If you have a chlorhexidine allergy or have previously reacted to chlorhexidine. On babies younger than 2 months  of age. How to use CHG solution Use CHG only as told by your health care provider, and follow the instructions on the label. Use the full amount of CHG as directed. Usually, this is one bottle. During a shower Follow these steps when using CHG solution during a shower (unless  your health care provider gives you different instructions): Start the shower. Use your normal soap and shampoo to wash your face and hair. Turn off the shower or move out of the shower stream. Pour the CHG onto a clean washcloth. Do not use any type of brush or rough-edged sponge. Starting at your neck, lather your body down to your toes. Make sure you follow these instructions: If you will be having surgery, pay special attention to the part of your body where you will be having surgery. Scrub this area for at least 1 minute. Do not use CHG on your head or face. If the solution gets into your ears or eyes, rinse them well with water. Avoid your genital area. Avoid any areas of skin that have broken skin, cuts, or scrapes. Scrub your back and under your arms. Make sure to wash skin folds. Let the lather sit on your skin for 1-2 minutes or as long as told by your health care provider. Thoroughly rinse your entire body in the shower. Make sure that all body creases and crevices are rinsed well. Dry off with a clean towel. Do not put any substances on your body afterward--such as powder, lotion, or perfume--unless you are told to do so by your health care provider. Only use lotions that are recommended by the manufacturer. Put on clean clothes or pajamas. If it is the night before your surgery, sleep in clean sheets.  During a sponge bath Follow these steps when using CHG solution during a sponge bath (unless your health care provider gives you different instructions): Use your normal soap and shampoo to wash your face and hair. Pour the CHG onto a clean washcloth. Starting at your neck, lather your body down to your  toes. Make sure you follow these instructions: If you will be having surgery, pay special attention to the part of your body where you will be having surgery. Scrub this area for at least 1 minute. Do not use CHG on your head or face. If the solution gets into your ears or eyes, rinse them well with water. Avoid your genital area. Avoid any areas of skin that have broken skin, cuts, or scrapes. Scrub your back and under your arms. Make sure to wash skin folds. Let the lather sit on your skin for 1-2 minutes or as long as told by your health care provider. Using a different clean, wet washcloth, thoroughly rinse your entire body. Make sure that all body creases and crevices are rinsed well. Dry off with a clean towel. Do not put any substances on your body afterward--such as powder, lotion, or perfume--unless you are told to do so by your health care provider. Only use lotions that are recommended by the manufacturer. Put on clean clothes or pajamas. If it is the night before your surgery, sleep in clean sheets. How to use CHG prepackaged cloths Only use CHG cloths as told by your health care provider, and follow the instructions on the label. Use the CHG cloth on clean, dry skin. Do not use the CHG cloth on your head or face unless your health care provider tells you to. When washing with the CHG cloth: Avoid your genital area. Avoid any areas of skin that have broken skin, cuts, or scrapes. Before surgery Follow these steps when using a CHG cloth to clean before surgery (unless your health care provider gives you different instructions): Using the CHG cloth, vigorously scrub the part of your body where you will be  having surgery. Scrub using a back-and-forth motion for 3 minutes. The area on your body should be completely wet with CHG when you are done scrubbing. Do not rinse. Discard the cloth and let the area air-dry. Do not put any substances on the area afterward, such as powder, lotion, or  perfume. Put on clean clothes or pajamas. If it is the night before your surgery, sleep in clean sheets.  For general bathing Follow these steps when using CHG cloths for general bathing (unless your health care provider gives you different instructions). Use a separate CHG cloth for each area of your body. Make sure you wash between any folds of skin and between your fingers and toes. Wash your body in the following order, switching to a new cloth after each step: The front of your neck, shoulders, and chest. Both of your arms, under your arms, and your hands. Your stomach and groin area, avoiding the genitals. Your right leg and foot. Your left leg and foot. The back of your neck, your back, and your buttocks. Do not rinse. Discard the cloth and let the area air-dry. Do not put any substances on your body afterward--such as powder, lotion, or perfume--unless you are told to do so by your health care provider. Only use lotions that are recommended by the manufacturer. Put on clean clothes or pajamas. Contact a health care provider if: Your skin gets irritated after scrubbing. You have questions about using your solution or cloth. You swallow any chlorhexidine. Call your local poison control center ([phone] in the [place] Get help right away if: Your eyes itch badly, or they become very red or swollen. Your skin itches badly and is red or swollen. Your hearing changes. You have trouble seeing. You have swelling or tingling in your mouth or throat. You have trouble breathing. These symptoms may represent a serious problem that is an emergency. Do not wait to see if the symptoms will go away. Get medical help right away. Call your local emergency services (911 in the [place] Do not drive yourself to the hospital. Summary Chlorhexidine gluconate (CHG) is a germ-killing (antiseptic) solution that is used to clean the skin. Cleaning your skin with CHG may help to lower your risk for  infection. You may be given CHG to use for bathing. It may be in a bottle or in a prepackaged cloth to use on your skin. Carefully follow your health care provider's instructions and the instructions on the product label. Do not use CHG if you have a chlorhexidine allergy. Contact your health care provider if your skin gets irritated after scrubbing. This information is not intended to replace advice given to you by your health care provider. Make sure you discuss any questions you have with your health care provider. Document Revised: 02/27/2022 Document Reviewed: 01/10/2021 Elsevier Patient Education  2023 [employer].

## 2023-03-24 NOTE — H&P (Signed)
[facility] Obstetrics and Gynecology Attending History and Physical  Crystal Coleman is a 67 y.o. postmenopausal female who presents for hysteroscopy, D&C due to PMB and inadequate endometrial sampling.  EMB attempted in office x 2  In review, initially reported PMB back in April.  Reports that about a month ago she noted occasional pink spotting when she wiped. Lasted for about 3-4 days. Had a 2nd episode of bleeding s/p cytotec to completed EMB procedure.  2nd EMB was thought to be successful; however, pathology showed only endocervical epithelium.  Due to thickened endometrium, she presents today for further evaluation.  Denies any abnormal vaginal discharge, fevers, chills, sweats, dysuria, nausea, vomiting, other GI or GU symptoms or other general symptoms.  Past Medical History:  Diagnosis Date   Arthritis    Borderline diabetes    Breast cyst    Complication of anesthesia    Diabetes mellitus without complication (HCC)    Hypertension    Peripheral edema    PONV (postoperative nausea and vomiting)    Past Surgical History:  Procedure Laterality Date   APPENDECTOMY     BREAST EXCISIONAL BIOPSY Right    benign   BREAST LUMPECTOMY WITH RADIOACTIVE SEED LOCALIZATION Right 12/02/2019   Procedure: RIGHT BREAST LUMPECTOMY WITH RADIOACTIVE SEED LOCALIZATION;  Surgeon: Abigail Miyamoto, MD;  Location: [facility];  Service: General;  Laterality: Right;   CHOLECYSTECTOMY  1990   COLONOSCOPY WITH PROPOFOL N/A 10/17/2021   Procedure: COLONOSCOPY WITH PROPOFOL;  Surgeon: Lanelle Bal, DO;  [facility];  Service: Endoscopy;  Laterality: N/A;  10:45am   POLYPECTOMY  10/17/2021   Procedure: POLYPECTOMY;  Surgeon: Lanelle Bal, DO;  [facility];  Service: Endoscopy;;   TUBAL LIGATION     OB History  Gravida Para Term Preterm AB Living  2         3  SAB IAB Ectopic Multiple Live Births               # Outcome Date GA Lbr Len/2nd  Weight Sex Delivery Anes PTL Lv  2 Gravida           1 Gravida           Patient denies any other pertinent gynecologic issues.  No current facility-administered medications on file prior to encounter.   Current Outpatient Medications on File Prior to Encounter  Medication Sig Dispense Refill   aspirin EC 81 MG tablet Take 81 mg by mouth daily.     carvedilol (COREG) 6.25 MG tablet Take 6.25 mg by mouth 2 (two) times daily with a meal.     hydrALAZINE (APRESOLINE) 50 MG tablet Take 50 mg by mouth 2 (two) times daily.      ibuprofen (ADVIL,MOTRIN) 200 MG tablet Take 200 mg by mouth every 6 (six) hours as needed for moderate pain.     lisinopril (ZESTRIL) 40 MG tablet Take 40 mg by mouth daily.     metFORMIN (GLUCOPHAGE) 500 MG tablet Take 500 mg by mouth 2 (two) times daily with a meal.     Multiple Vitamins-Minerals (CENTRUM SILVER 50+WOMEN PO) Take 1 tablet by mouth daily.     medroxyPROGESTERone (PROVERA) 10 MG tablet Take 1 tablet (10 mg total) by mouth daily. (Patient not taking: Reported on 03/23/2023) 10 tablet 0   misoprostol (CYTOTEC) 200 MCG tablet Place 2 tablets in the vagina the night before appointment 2 tablet 0   Allergies  Allergen Reactions  Penicillins Hives    Has patient had a PCN reaction causing immediate rash, facial/tongue/throat swelling, SOB or lightheadedness with hypotension: Yes Has patient had a PCN reaction causing severe rash involving mucus membranes or skin necrosis: No Has patient had a PCN reaction that required hospitalization No Has patient had a PCN reaction occurring within the last 10 years: No If all of the above answers are "NO", then may proceed with Cephalosporin use.     Social History:   reports that she has never smoked. She has never used smokeless tobacco. She reports that she does not drink alcohol and does not use drugs. Family History  Problem Relation Age of Onset   Dementia Mother    Cancer Father    Colon cancer Neg Hx     Colon polyps Neg Hx     Review of Systems: Pertinent items noted in HPI and remainder of comprehensive ROS otherwise negative.  PHYSICAL EXAM: There were no vitals taken for this visit. CONSTITUTIONAL: Well-developed, well-nourished female in no acute distress.  SKIN: Skin is warm and dry. No rash noted. Not diaphoretic. No erythema. No pallor. NEUROLOGIC: Alert and oriented to person, place, and time. Normal reflexes, muscle tone coordination. No cranial nerve deficit noted. PSYCHIATRIC: Normal mood and affect. Normal behavior. Normal judgment and thought content. CARDIOVASCULAR: Normal heart rate noted, regular rhythm RESPIRATORY: Effort and breath sounds normal, no problems with respiration noted ABDOMEN: Soft, nontender, nondistended. PELVIC: deferred MUSCULOSKELETAL: no calf tenderness bilaterally EXT: no edema bilaterally, normal pulses  Labs: Results for orders placed or performed during the hospital encounter of 03/22/23 (from the past 336 hour(s))  CBC with Differential/Platelet   Collection Time: 03/22/23  1:59 PM  Result Value Ref Range   WBC 7.8 4.0 - 10.5 K/uL   RBC 4.20 3.87 - 5.11 MIL/uL   Hemoglobin 13.8 12.0 - 15.0 g/dL   HCT 40.9 81.1 - 91.4 %   MCV 97.1 80.0 - 100.0 fL   MCH 32.9 26.0 - 34.0 pg   MCHC 33.8 30.0 - 36.0 g/dL   RDW 78.2 95.6 - 21.3 %   Platelets 227 150 - 400 K/uL   nRBC 0.0 0.0 - 0.2 %   Neutrophils Relative % 55 %   Neutro Abs 4.2 1.7 - 7.7 K/uL   Lymphocytes Relative 33 %   Lymphs Abs 2.6 0.7 - 4.0 K/uL   Monocytes Relative 10 %   Monocytes Absolute 0.8 0.1 - 1.0 K/uL   Eosinophils Relative 2 %   Eosinophils Absolute 0.1 0.0 - 0.5 K/uL   Basophils Relative 0 %   Basophils Absolute 0.0 0.0 - 0.1 K/uL   Immature Granulocytes 0 %   Abs Immature Granulocytes 0.03 0.00 - 0.07 K/uL  Basic metabolic panel   Collection Time: 03/22/23  1:59 PM  Result Value Ref Range   Sodium 137 135 - 145 mmol/L   Potassium 4.0 3.5 - 5.1 mmol/L   Chloride  104 98 - 111 mmol/L   CO2 24 22 - 32 mmol/L   Glucose, Bld 87 70 - 99 mg/dL   BUN 15 8 - 23 mg/dL   Creatinine, Ser 0.86 0.44 - 1.00 mg/dL   Calcium 9.1 8.9 - 57.8 mg/dL   GFR, Estimated >46 >96 mL/min   Anion gap 9 5 - 15  Hemoglobin A1c   Collection Time: 03/22/23  1:59 PM  Result Value Ref Range   Hgb A1c MFr Bld 6.4 (H) 4.8 - 5.6 %   Mean Plasma Glucose  136.98 mg/dL  Results for orders placed or performed in visit on 03/14/23 (from the past 336 hour(s))  Surgical pathology( [place])   Collection Time: 03/14/23 12:26 PM  Result Value Ref Range   SURGICAL PATHOLOGY      SURGICAL PATHOLOGY CASE: [id number] PATIENT: [facility] Surgical Pathology Report     Clinical History: PMB (cm)     FINAL MICROSCOPIC DIAGNOSIS:  A. ENDOMETRIUM, BIOPSY: - Fragments of benign endocervical epithelium with mucohemorrhagic debris - No definitive endometrial tissue identified  GROSS DESCRIPTION:  A. Received in formalin labeled with the patients name and DOB is a 2.7 x 2.5 x 0.5 cm aggregate of red-brown soft tissue fragments, submitted in toto in two cassette(s).  (LEF 03/15/2023)   Final Diagnosis performed by Clifton James, MD.   Electronically signed 03/19/2023 Technical component performed at [facility]. [facility], [address], [place], [place] [address].  Professional component performed at [facility], [address], [place], [place] [address].  Immunohistochemistry Technical component (if applicable) was performed at [facility] [address], [place], [place] [address].   IMMUNOHISTOCHEMISTRY DISCLAIMER (if applicable): Some of these immunohistochemical stains may have been developed and the performance characteristics determine by [facility]. Some may not have been cleared or approved by the U.S. Food and Drug Administration. The FDA has determined that such clearance or  approval is not necessary. This test is used for clinical purposes. It should not be regarded as investigational or for research. This laboratory is certified under the Clinical Laboratory Improvement Amendments of 1988 (CLIA-88) as qualified to perform high complexity clinical laboratory testing.  The controls stained appropriately.     Imaging Studies:  9.4 x 6.0 x 7.9 cm = volume: 234 mL. Heterogeneous echotexture. Multiple echogenic heterogeneous masses, likely fibroids, the largest in the fundus measuring up to 3.2 cm. Lower uterine segment 2 cm fibroid posteriorly. Other smaller fibroids noted.  Endometrium 13mm   Assessment: Postmenopausal bleeding Thickened endometrium   Plan: Hysteroscopy, D&C -NPO -LR @ 125cc/hr -SCDs to OR -Risk/benefits and alternatives reviewed with the patient including but not limited to risk of bleeding, infection and possibility of uterine perforation.  Should uterine perforation occur discussed potential for injury to surrounding organs and need for additional surgery.  Questions and concerns were addressed and pt desires to proceed  Myna Hidalgo, DO Attending Obstetrician & Gynecologist, [facility] for [facility], [facility]

## 2023-03-27 ENCOUNTER — Ambulatory Visit (HOSPITAL_COMMUNITY): Payer: Medicare HMO | Admitting: Anesthesiology

## 2023-03-27 ENCOUNTER — Ambulatory Visit (HOSPITAL_COMMUNITY)
Admission: RE | Admit: 2023-03-27 | Discharge: 2023-03-27 | Disposition: A | Discharge status: Home / Self Care | Payer: Medicare HMO | Attending: Obstetrics & Gynecology | Admitting: Obstetrics & Gynecology

## 2023-03-27 ENCOUNTER — Ambulatory Visit (HOSPITAL_BASED_OUTPATIENT_CLINIC_OR_DEPARTMENT_OTHER): Payer: Medicare HMO | Admitting: Anesthesiology

## 2023-03-27 ENCOUNTER — Encounter (HOSPITAL_COMMUNITY): Payer: Self-pay | Admitting: Obstetrics & Gynecology

## 2023-03-27 ENCOUNTER — Encounter (HOSPITAL_COMMUNITY)
Admission: RE | Disposition: A | Discharge status: Home / Self Care | Payer: Self-pay | Source: Home / Self Care | Attending: Obstetrics & Gynecology

## 2023-03-27 DIAGNOSIS — Z6841 Body Mass Index (BMI) 40.0 and over, adult: Secondary | ICD-10-CM | POA: Diagnosis not present

## 2023-03-27 DIAGNOSIS — Z7984 Long term (current) use of oral hypoglycemic drugs: Secondary | ICD-10-CM | POA: Insufficient documentation

## 2023-03-27 DIAGNOSIS — N840 Polyp of corpus uteri: Secondary | ICD-10-CM

## 2023-03-27 DIAGNOSIS — E119 Type 2 diabetes mellitus without complications: Secondary | ICD-10-CM

## 2023-03-27 DIAGNOSIS — N950 Postmenopausal bleeding: Secondary | ICD-10-CM | POA: Diagnosis not present

## 2023-03-27 DIAGNOSIS — I10 Essential (primary) hypertension: Secondary | ICD-10-CM | POA: Diagnosis not present

## 2023-03-27 DIAGNOSIS — Z01818 Encounter for other preprocedural examination: Secondary | ICD-10-CM

## 2023-03-27 HISTORY — PX: HYSTEROSCOPY WITH D & C: SHX1775

## 2023-03-27 LAB — GLUCOSE, CAPILLARY
Glucose-Capillary: 116 mg/dL — ABNORMAL HIGH (ref 70–99)
Glucose-Capillary: 118 mg/dL — ABNORMAL HIGH (ref 70–99)

## 2023-03-27 SURGERY — DILATATION AND CURETTAGE /HYSTEROSCOPY
Anesthesia: General | Site: Cervix

## 2023-03-27 MED ORDER — LIDOCAINE HCL (CARDIAC) PF 100 MG/5ML IV SOSY
PREFILLED_SYRINGE | INTRAVENOUS | Status: DC | PRN
  Administered 2023-03-27: 100 mg via INTRAVENOUS

## 2023-03-27 MED ORDER — FENTANYL CITRATE (PF) 100 MCG/2ML IJ SOLN
INTRAMUSCULAR | Status: AC
  Filled 2023-03-27: qty 2

## 2023-03-27 MED ORDER — LIDOCAINE-EPINEPHRINE (PF) 1 %-1:200000 IJ SOLN
INTRAMUSCULAR | Status: DC | PRN
  Administered 2023-03-27: 20 mL

## 2023-03-27 MED ORDER — PHENYLEPHRINE 80 MCG/ML (10ML) SYRINGE FOR IV PUSH (FOR BLOOD PRESSURE SUPPORT)
PREFILLED_SYRINGE | INTRAVENOUS | Status: AC
  Filled 2023-03-27: qty 10

## 2023-03-27 MED ORDER — LACTATED RINGERS IV SOLN: INTRAVENOUS | Status: DC

## 2023-03-27 MED ORDER — MIDAZOLAM HCL 2 MG/2ML IJ SOLN
INTRAMUSCULAR | Status: AC
  Filled 2023-03-27: qty 2

## 2023-03-27 MED ORDER — LIDOCAINE-EPINEPHRINE 0.5 %-1:200000 IJ SOLN: INTRAMUSCULAR | Status: DC | PRN

## 2023-03-27 MED ORDER — PROPOFOL 10 MG/ML IV BOLUS
INTRAVENOUS | Status: AC
  Filled 2023-03-27: qty 20

## 2023-03-27 MED ORDER — PROPOFOL 10 MG/ML IV BOLUS
INTRAVENOUS | Status: DC | PRN
  Administered 2023-03-27: 150 mg via INTRAVENOUS

## 2023-03-27 MED ORDER — MEPERIDINE HCL 50 MG/ML IJ SOLN: 6.2500 mg | INTRAMUSCULAR | Status: DC | PRN

## 2023-03-27 MED ORDER — ONDANSETRON HCL 4 MG/2ML IJ SOLN: 4.0000 mg | Freq: Once | INTRAMUSCULAR | Status: DC | PRN

## 2023-03-27 MED ORDER — GLYCOPYRROLATE PF 0.2 MG/ML IJ SOSY
PREFILLED_SYRINGE | INTRAMUSCULAR | Status: DC | PRN
  Administered 2023-03-27: .2 mg via INTRAVENOUS

## 2023-03-27 MED ORDER — MIDAZOLAM HCL 5 MG/5ML IJ SOLN
INTRAMUSCULAR | Status: DC | PRN
  Administered 2023-03-27: 2 mg via INTRAVENOUS

## 2023-03-27 MED ORDER — SODIUM CHLORIDE 0.9 % IR SOLN
Status: DC | PRN
  Administered 2023-03-27: 3000 mL

## 2023-03-27 MED ORDER — PHENYLEPHRINE 80 MCG/ML (10ML) SYRINGE FOR IV PUSH (FOR BLOOD PRESSURE SUPPORT)
PREFILLED_SYRINGE | INTRAVENOUS | Status: DC | PRN
  Administered 2023-03-27 (×3): 80 ug via INTRAVENOUS

## 2023-03-27 MED ORDER — ONDANSETRON HCL 4 MG/2ML IJ SOLN
INTRAMUSCULAR | Status: DC | PRN
  Administered 2023-03-27: 4 mg via INTRAVENOUS

## 2023-03-27 MED ORDER — KETOROLAC TROMETHAMINE 30 MG/ML IJ SOLN
INTRAMUSCULAR | Status: AC
  Filled 2023-03-27: qty 1

## 2023-03-27 MED ORDER — LIDOCAINE-EPINEPHRINE (PF) 1 %-1:200000 IJ SOLN
INTRAMUSCULAR | Status: AC
  Filled 2023-03-27: qty 30

## 2023-03-27 MED ORDER — ORAL CARE MOUTH RINSE: 15.0000 mL | Freq: Once | OROMUCOSAL | Status: AC

## 2023-03-27 MED ORDER — ONDANSETRON HCL 4 MG/2ML IJ SOLN
INTRAMUSCULAR | Status: AC
  Filled 2023-03-27: qty 2

## 2023-03-27 MED ORDER — HYDROMORPHONE HCL 1 MG/ML IJ SOLN: 0.2500 mg | INTRAMUSCULAR | Status: DC | PRN

## 2023-03-27 MED ORDER — LIDOCAINE HCL (PF) 2 % IJ SOLN
INTRAMUSCULAR | Status: AC
  Filled 2023-03-27: qty 5

## 2023-03-27 MED ORDER — CHLORHEXIDINE GLUCONATE 0.12 % MT SOLN
15.0000 mL | Freq: Once | OROMUCOSAL | Status: AC
  Administered 2023-03-27: 15 mL via OROMUCOSAL

## 2023-03-27 MED ORDER — GLYCOPYRROLATE PF 0.2 MG/ML IJ SOSY
PREFILLED_SYRINGE | INTRAMUSCULAR | Status: AC
  Filled 2023-03-27: qty 1

## 2023-03-27 MED ORDER — DEXAMETHASONE SODIUM PHOSPHATE 10 MG/ML IJ SOLN
INTRAMUSCULAR | Status: DC | PRN
  Administered 2023-03-27: 10 mg via INTRAVENOUS

## 2023-03-27 MED ORDER — FENTANYL CITRATE (PF) 250 MCG/5ML IJ SOLN
INTRAMUSCULAR | Status: DC | PRN
  Administered 2023-03-27 (×3): 50 ug via INTRAVENOUS

## 2023-03-27 MED ORDER — DEXAMETHASONE SODIUM PHOSPHATE 10 MG/ML IJ SOLN
INTRAMUSCULAR | Status: AC
  Filled 2023-03-27: qty 1

## 2023-03-27 SURGICAL SUPPLY — 30 items
CATH ROBINSON RED A/P 16FR (CATHETERS) IMPLANT
CLOTH BEACON ORANGE TIMEOUT ST (SAFETY) ×1 IMPLANT
COVER LIGHT HANDLE STERIS (MISCELLANEOUS) ×3 IMPLANT
DEVICE MYOSURE LITE (MISCELLANEOUS) IMPLANT
DEVICE MYOSURE REACH (MISCELLANEOUS) IMPLANT
DILATOR CANAL MILEX (MISCELLANEOUS) IMPLANT
GAUZE 4X4 16PLY ~~LOC~~+RFID DBL (SPONGE) ×2 IMPLANT
GLOVE BIO SURGEON STRL SZ 6.5 (GLOVE) ×1 IMPLANT
GLOVE BIOGEL PI IND STRL 7.0 (GLOVE) ×3 IMPLANT
GOWN STRL REUS W/ TWL LRG LVL3 (GOWN DISPOSABLE) ×1 IMPLANT
GOWN STRL REUS W/TWL LRG LVL3 (GOWN DISPOSABLE) ×2 IMPLANT
IV NS IRRIG 3000ML ARTHROMATIC (IV SOLUTION) ×1 IMPLANT
KIT PROCEDURE FLUENT (KITS) ×1 IMPLANT
KIT TURNOVER CYSTO (KITS) ×1 IMPLANT
KIT TURNOVER KIT A (KITS) ×1 IMPLANT
MYOSURE XL FIBROID (MISCELLANEOUS)
NDL HYPO 18GX1.5 BLUNT FILL (NEEDLE) ×1 IMPLANT
NEEDLE HYPO 18GX1.5 BLUNT FILL (NEEDLE) ×1 IMPLANT
NS IRRIG 1000ML POUR BTL (IV SOLUTION) ×1 IMPLANT
PACK PERI GYN (CUSTOM PROCEDURE TRAY) ×1 IMPLANT
PAD ARMBOARD 7.5X6 YLW CONV (MISCELLANEOUS) ×1 IMPLANT
PAD TELFA 3X4 1S STER (GAUZE/BANDAGES/DRESSINGS) ×1 IMPLANT
SEAL ROD LENS SCOPE MYOSURE (ABLATOR) ×1 IMPLANT
SET BASIN LINEN APH (SET/KITS/TRAYS/PACK) ×1 IMPLANT
SOL PREP POV-IOD 4OZ 10% (MISCELLANEOUS) ×1 IMPLANT
SYR 30ML LL (SYRINGE) ×1 IMPLANT
SYR CONTROL 10ML LL (SYRINGE) ×1 IMPLANT
SYSTEM TISS REMOVAL MYOSURE XL (MISCELLANEOUS) IMPLANT
TOWEL OR 17X26 4PK STRL BLUE (TOWEL DISPOSABLE) ×1 IMPLANT
UNDERPAD 30X36 HEAVY ABSORB (UNDERPADS AND DIAPERS) ×1 IMPLANT

## 2023-03-27 NOTE — Transfer of Care (Signed)
Immediate Anesthesia Transfer of Care Note  Patient: Crystal Coleman  Procedure(s) Performed: DILATATION AND CURETTAGE /HYSTEROSCOPY WITH MYOSURE (Cervix)  Patient Location: PACU  Anesthesia Type:General  Level of Consciousness: awake, alert , and oriented  Airway & Oxygen Therapy: Patient Spontanous Breathing and Patient connected to face mask oxygen  Post-op Assessment: Report given to RN and Post -op Vital signs reviewed and stable  Post vital signs: Reviewed and stable  Last Vitals:  Vitals Value Taken Time  BP 153/85 03/27/23 1145  Temp    Pulse 66 03/27/23 1146  Resp 19 03/27/23 1146  SpO2 100 % 03/27/23 1146  Vitals shown include unvalidated device data.  Last Pain:  Vitals:   03/27/23 1006  TempSrc: Oral  PainSc: 0-No pain      Patients Stated Pain Goal: 6 (03/27/23 1006)  Complications: No notable events documented.

## 2023-03-27 NOTE — Discharge Instructions (Signed)
HOME INSTRUCTIONS  Please note any unusual or excessive bleeding, pain, swelling. Mild dizziness or drowsiness are normal for about 24 hours after surgery.   Shower when comfortable  Restrictions: No driving for 24 hours or while taking pain medications.  Activity:  Nothing in vagina (no tampons, douching, or intercourse) x 2 weeks; no tub baths for 2 weeks Vaginal spotting is expected but if your bleeding is heavy, period like,  please call the office   Diet:  You may return to your regular diet.  Do not eat large meals.  Eat small frequent meals throughout the day.  Continue to drink a good amount of water at least 6-8 glasses of water per day, hydration is very important for the healing process.  Pain Management: Take over the counter tylenol or ibuprofen as needed for pain.  You can either take one or alternate between the two medications for pain management.  You may also use a heating pack as needed.    Alcohol -- Avoid for 24 hours and while taking pain medications.  Nausea: Take sips of ginger ale or soda  Fever -- Call physician if temperature over 101 degrees  Follow up:  If you experience fever (a temperature greater than 100.4), pain unrelieved by pain medication, shortness of breath, swelling of a single leg, or any other symptoms which are concerning to you please the office immediately.

## 2023-03-27 NOTE — Anesthesia Procedure Notes (Signed)
Procedure Name: LMA Insertion Date/Time: 03/27/2023 11:04 AM  Performed by: Izola Price., CRNAPre-anesthesia Checklist: Patient identified, Emergency Drugs available, Suction available and Patient being monitored Patient Re-evaluated:Patient Re-evaluated prior to induction Oxygen Delivery Method: Circle System Utilized Preoxygenation: Pre-oxygenation with 100% oxygen Induction Type: IV induction Ventilation: Mask ventilation without difficulty LMA: LMA inserted LMA Size: 4.0 Number of attempts: 1 Placement Confirmation: positive ETCO2 Tube secured with: Tape Dental Injury: Teeth and Oropharynx as per pre-operative assessment

## 2023-03-27 NOTE — Op Note (Signed)
Operative Report  PreOp: 1) postmenopausal bleeding PostOp: same and endometrial polyp Procedure:  Hysteroscopy, Dilation and Curettage, myosure polypectomy Surgeon: Dr. Myna Hidalgo Anesthesia: General Complications:none EBL: Minimal UOP: 300cc IVF:800cc Discrepancy: 335cc  Findings: 9cm anteverted uterus, endometrial polyp noted.  Both ostia seen  Specimens: 1) endometrial polyp  Procedure: The patient was taken to the operating room where she underwent general anesthesia without difficulty. The patient was placed in a low lithotomy position using Allen stirrups. She was then prepped and draped in the normal sterile fashion. Sterile speculum was placed.  A single tooth tenaculum was placed on the anterior lip of the cervix. Cervical block was completed using 0.5% lidocaine with epinephrine.   The uterus was then sounded to 9cm. The endocervical canal was then serially dilated the os finder and the Hank dilators to accommodate the hysteroscopy.   The hysteroscope was inserted and findings were visualized as noted above.  Myosure was used for resection of the endometrial polyp and sampling of the endometrium. The hysteroscope was removed under direct visualization.  All instrument were then removed. Hemostasis was observed at the cervical site. The patient was repositioned to the supine position. The patient tolerated the procedure without any complications and taken to recovery in stable condition.   Myna Hidalgo, DO Attending Obstetrician & Gynecologist, [facility] for [employer], [facility]

## 2023-03-27 NOTE — Anesthesia Preprocedure Evaluation (Addendum)
Anesthesia Evaluation  Patient identified by MRN, date of birth, ID band Patient awake    Reviewed: Allergy & Precautions, H&P , NPO status , Patient's Chart, lab work & pertinent test results  History of Anesthesia Complications (+) PONV and history of anesthetic complications  Airway Mallampati: II  TM Distance: >3 FB Neck ROM: Full    Dental  (+) Dental Advisory Given, Missing, Chipped Multiple chipped teeth:   Pulmonary neg pulmonary ROS   Pulmonary exam normal breath sounds clear to auscultation       Cardiovascular hypertension, Pt. on medications Normal cardiovascular exam Rhythm:Regular Rate:Normal     Neuro/Psych negative neurological ROS  negative psych ROS   GI/Hepatic negative GI ROS, Neg liver ROS,,,  Endo/Other  diabetes, Well Controlled, Type 2, Oral Hypoglycemic Agents  Morbid obesity  Renal/GU negative Renal ROS  negative genitourinary   Musculoskeletal  (+) Arthritis , Osteoarthritis,    Abdominal   Peds negative pediatric ROS (+)  Hematology negative hematology ROS (+)   Anesthesia Other Findings   Reproductive/Obstetrics negative OB ROS                             Anesthesia Physical Anesthesia Plan  ASA: 3  Anesthesia Plan: General   Post-op Pain Management: Dilaudid IV   Induction: Intravenous  PONV Risk Score and Plan: 4 or greater and Ondansetron, Dexamethasone and Metaclopromide  Airway Management Planned: Oral ETT  Additional Equipment:   Intra-op Plan:   Post-operative Plan: Extubation in OR  Informed Consent: I have reviewed the patients History and Physical, chart, labs and discussed the procedure including the risks, benefits and alternatives for the proposed anesthesia with the patient or authorized representative who has indicated his/her understanding and acceptance.     Dental advisory given  Plan Discussed with: CRNA and  Surgeon  Anesthesia Plan Comments:        Anesthesia Quick Evaluation

## 2023-03-27 NOTE — Anesthesia Postprocedure Evaluation (Signed)
Anesthesia Post Note  Patient: Crystal Coleman  Procedure(s) Performed: DILATATION AND CURETTAGE /HYSTEROSCOPY WITH MYOSURE (Cervix)  Patient location during evaluation: Phase II Anesthesia Type: General Level of consciousness: awake and alert and oriented Pain management: pain level controlled Vital Signs Assessment: post-procedure vital signs reviewed and stable Respiratory status: spontaneous breathing, nonlabored ventilation and respiratory function stable Cardiovascular status: blood pressure returned to baseline and stable Postop Assessment: no apparent nausea or vomiting Anesthetic complications: no  No notable events documented.   Last Vitals:  Vitals:   03/27/23 1215 03/27/23 1221  BP: (!) 142/81 (!) 153/68  Pulse: 61 69  Resp: 14 15  Temp:  36.7 C  SpO2: 92% 99%    Last Pain:  Vitals:   03/27/23 1221  TempSrc: Oral  PainSc: 0-No pain                 Maicy Filip C Greyden Besecker

## 2023-03-28 LAB — SURGICAL PATHOLOGY

## 2023-04-02 ENCOUNTER — Encounter (HOSPITAL_COMMUNITY): Payer: Self-pay | Admitting: Obstetrics & Gynecology

## 2024-01-17 ENCOUNTER — Other Ambulatory Visit (HOSPITAL_COMMUNITY): Payer: Self-pay | Admitting: Family Medicine

## 2024-01-17 DIAGNOSIS — Z1231 Encounter for screening mammogram for malignant neoplasm of breast: Secondary | ICD-10-CM

## 2024-01-21 ENCOUNTER — Ambulatory Visit (HOSPITAL_COMMUNITY)
Admission: RE | Admit: 2024-01-21 | Discharge: 2024-01-21 | Disposition: A | Discharge status: Home / Self Care | Source: Ambulatory Visit | Attending: Family Medicine | Admitting: Family Medicine

## 2024-01-21 DIAGNOSIS — Z1231 Encounter for screening mammogram for malignant neoplasm of breast: Secondary | ICD-10-CM | POA: Insufficient documentation

## 2024-12-30 ENCOUNTER — Ambulatory Visit: Admitting: Obstetrics & Gynecology
